# Patient Record
Sex: Female | Born: 1985 | Race: White | Hispanic: No | Marital: Married | Smoking: Former smoker
Health system: Southern US, Community
[De-identification: ages and names within clinical notes are randomized; demographics above are authoritative.]

## PROBLEM LIST (undated history)

## (undated) ENCOUNTER — Inpatient Hospital Stay (HOSPITAL_COMMUNITY): Payer: Self-pay

## (undated) DIAGNOSIS — F329 Major depressive disorder, single episode, unspecified: Secondary | ICD-10-CM

## (undated) DIAGNOSIS — F418 Other specified anxiety disorders: Secondary | ICD-10-CM

## (undated) DIAGNOSIS — K8020 Calculus of gallbladder without cholecystitis without obstruction: Secondary | ICD-10-CM

## (undated) DIAGNOSIS — Z91018 Allergy to other foods: Secondary | ICD-10-CM

## (undated) DIAGNOSIS — Z0000 Encounter for general adult medical examination without abnormal findings: Secondary | ICD-10-CM

## (undated) DIAGNOSIS — K859 Acute pancreatitis without necrosis or infection, unspecified: Secondary | ICD-10-CM

## (undated) DIAGNOSIS — R81 Glycosuria: Secondary | ICD-10-CM

## (undated) DIAGNOSIS — J45909 Unspecified asthma, uncomplicated: Secondary | ICD-10-CM

## (undated) DIAGNOSIS — T7840XA Allergy, unspecified, initial encounter: Secondary | ICD-10-CM

## (undated) DIAGNOSIS — F32A Depression, unspecified: Secondary | ICD-10-CM

## (undated) HISTORY — PX: DILATION AND CURETTAGE OF UTERUS: SHX78

## (undated) HISTORY — PX: OTHER SURGICAL HISTORY: SHX169

## (undated) HISTORY — DX: Allergy, unspecified, initial encounter: T78.40XA

## (undated) HISTORY — DX: Major depressive disorder, single episode, unspecified: F32.9

## (undated) HISTORY — DX: Encounter for general adult medical examination without abnormal findings: Z00.00

## (undated) HISTORY — DX: Other specified anxiety disorders: F41.8

## (undated) HISTORY — DX: Allergy to other foods: Z91.018

## (undated) HISTORY — DX: Depression, unspecified: F32.A

## (undated) HISTORY — DX: Unspecified asthma, uncomplicated: J45.909

## (undated) HISTORY — DX: Glycosuria: R81

---
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
## 2007-09-10 IMAGING — CT CT PELVIS W/ CM
2 of 5 series · 17 of 46 positions shown, 19 images · IV contrast (READICAT/WATER & [ID] OMNI 300)
Comparison: none

HISTORY: Periumbilical pelvic and left lower quadrant pain, one week status post
cholecystectomy

[Series 102: a&p w/ · axial · 0.64mm/px · z∈[-428,-62]mm · 14 of 399 slices shown, 16 images]
[im 17/399  soft-tissue]
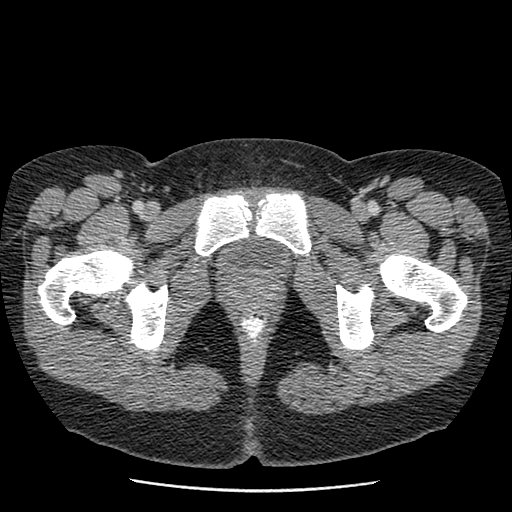
[im 17/399  bone]
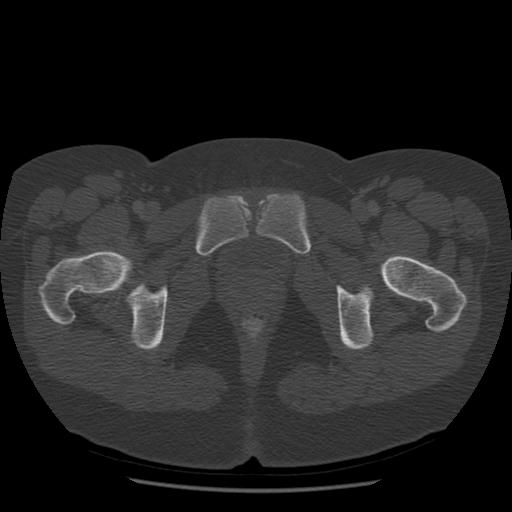
[im 50/399  soft-tissue]
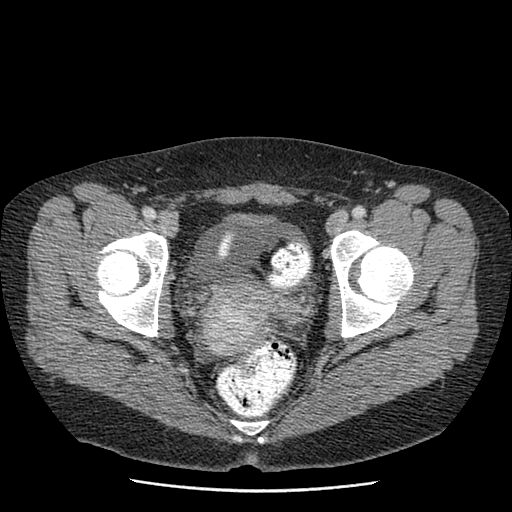
[im 83/399  soft-tissue]
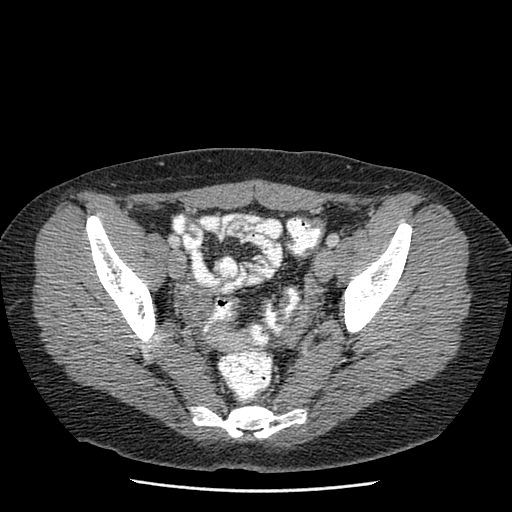
[im 100/399  soft-tissue]
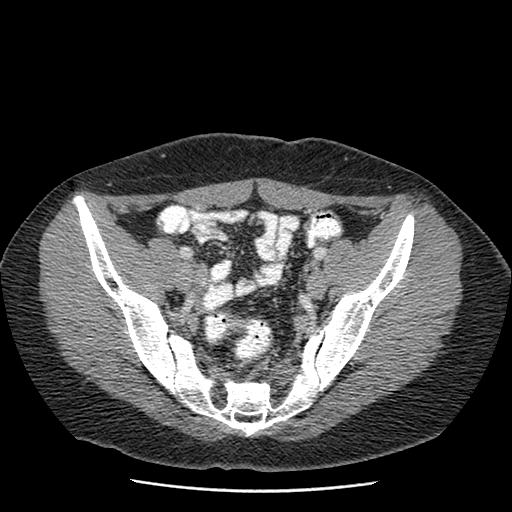
[im 133/399  soft-tissue]
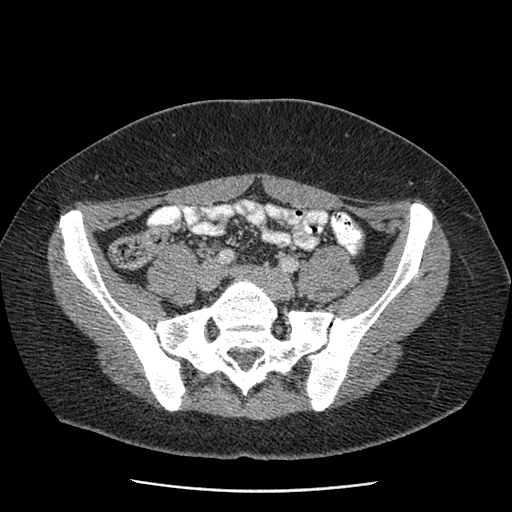
[im 166/399  soft-tissue]
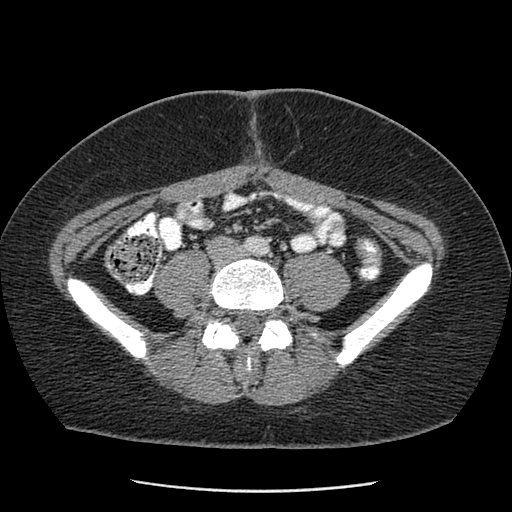
[im 183/399  soft-tissue]
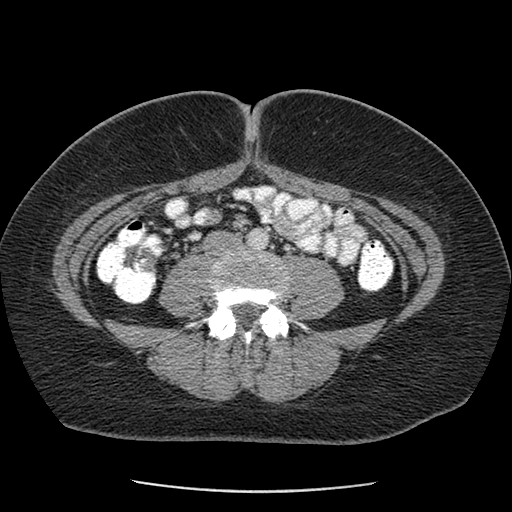
[im 216/399  soft-tissue]
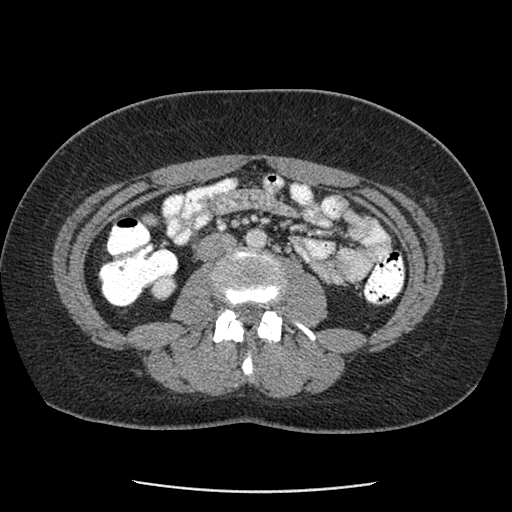
[im 233/399  soft-tissue]
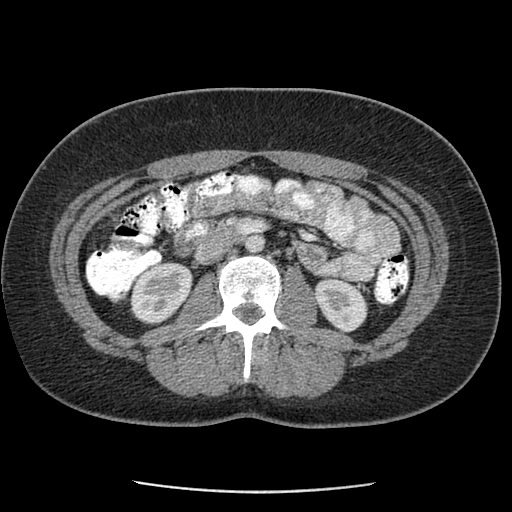
[im 233/399  bone]
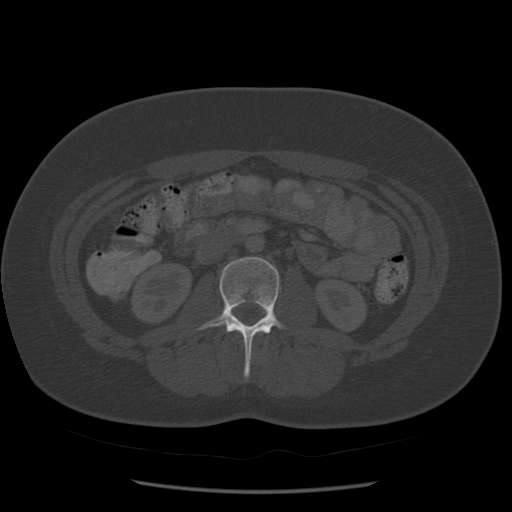
[im 266/399  soft-tissue]
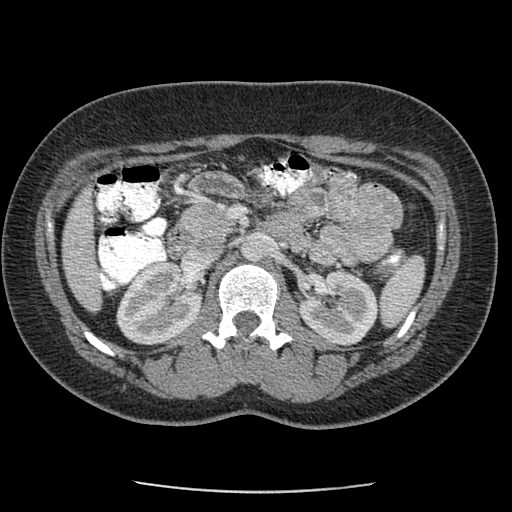
[im 299/399  soft-tissue]
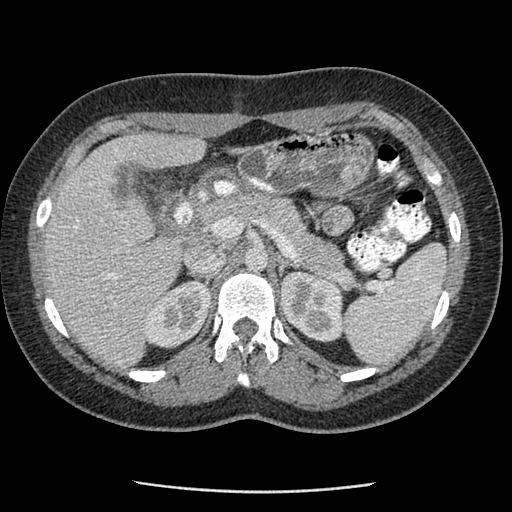
[im 316/399  soft-tissue]
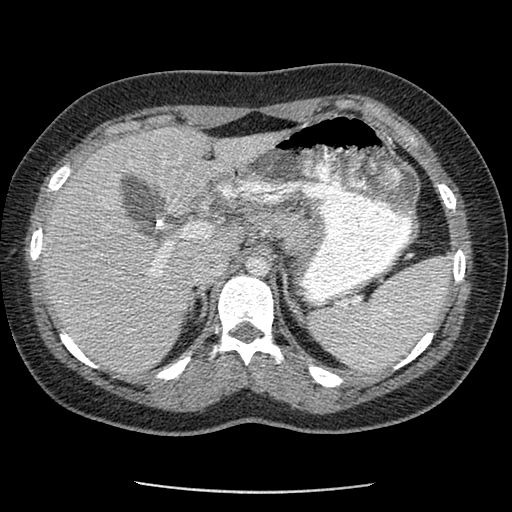
[im 349/399  soft-tissue]
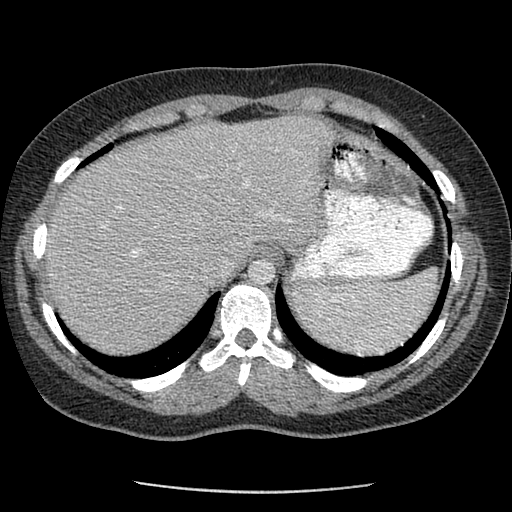
[im 382/399  soft-tissue]
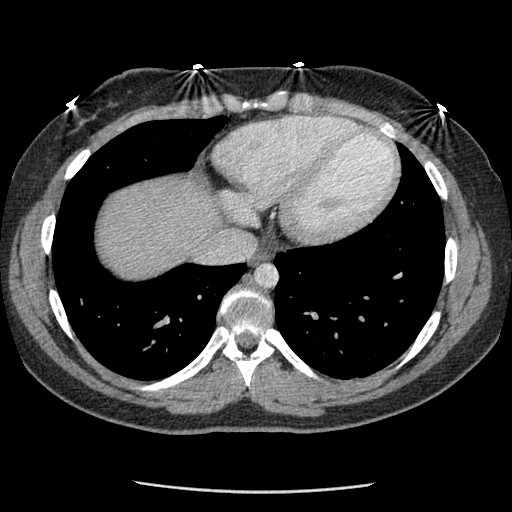

[Series 105: reformatted · coronal · 0.80mm/px · 3 of 86 slices shown]
[im 29/86  soft-tissue]
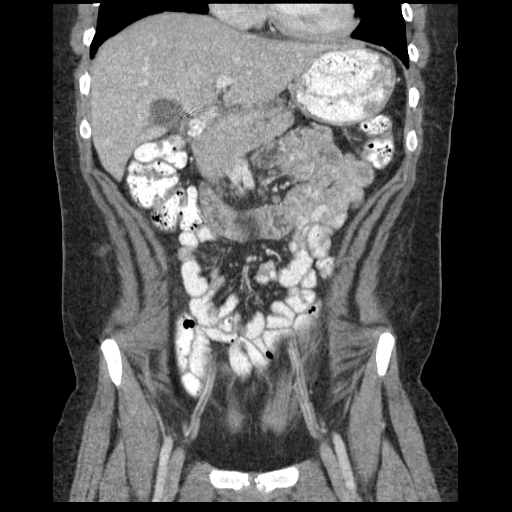
[im 38/86  soft-tissue]
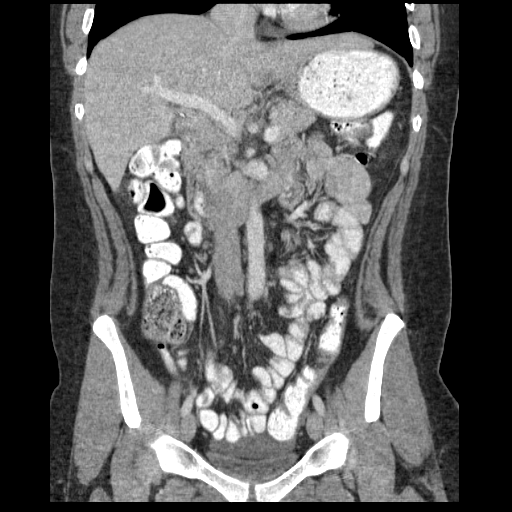
[im 48/86  soft-tissue]
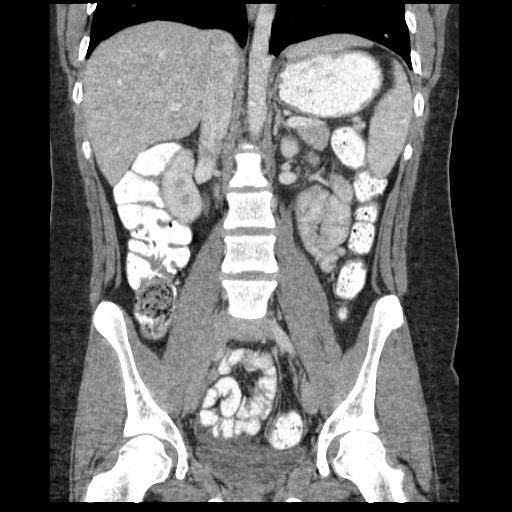

[17 of 46 positions shown; findings below may reference images not displayed]

CT ABDOMEN AND PELVIS WITH CONTRAST:

Multidetector helical CT imaging abdomen and pelvis performed.
Exam utilized dilute oral contrast and 125 cc [id number]-YHH.
No prior exam for comparison.

CT ABDOMEN:

Lung bases clear.
Status post cholecystectomy.
Small amount of fluid in gallbladder fossa.
Expected post surgical changes anterior abdominal wall and subhepatic region.
Remainder of liver, spleen, pancreas, kidneys, and adrenal glands normal.
Tiny splenule at splenic hilum.
Upper abdominal bowel loops normal appearance.
No mass, adenopathy, or hernia.
IMPRESSION: Status post cholecystectomy with small amount of nonspecific fluid in
gallbladder fossa.
This may be normal for postcholecystectomy patient, although it is not possible
by CT to exclude infection within this collection.
No evidence of enhancing margins or gas to suggest abscess.
Remainder exam unremarkable.

CT PELVIS:

Normal appendix.
Normal appearance of uterus and ovaries by CT.
Minimal free pelvic fluid.
Pelvic bowel loops normal appearance.
No mass, adenopathy, focal inflammatory process, or hernia.
IMPRESSION: Minimal free pelvic fluid.
Otherwise negative exam.

## 2009-03-15 ENCOUNTER — Encounter: Payer: Self-pay | Admitting: Family Medicine

## 2010-04-17 ENCOUNTER — Ambulatory Visit
Admission: RE | Admit: 2010-04-17 | Discharge: 2010-04-17 | Payer: Self-pay | Source: Home / Self Care | Attending: Family Medicine | Admitting: Family Medicine

## 2010-04-17 DIAGNOSIS — H9190 Unspecified hearing loss, unspecified ear: Secondary | ICD-10-CM | POA: Insufficient documentation

## 2010-04-17 DIAGNOSIS — Z8719 Personal history of other diseases of the digestive system: Secondary | ICD-10-CM | POA: Insufficient documentation

## 2010-04-17 DIAGNOSIS — K219 Gastro-esophageal reflux disease without esophagitis: Secondary | ICD-10-CM | POA: Insufficient documentation

## 2010-04-17 DIAGNOSIS — F411 Generalized anxiety disorder: Secondary | ICD-10-CM | POA: Insufficient documentation

## 2010-04-17 DIAGNOSIS — F418 Other specified anxiety disorders: Secondary | ICD-10-CM | POA: Insufficient documentation

## 2010-04-17 DIAGNOSIS — J309 Allergic rhinitis, unspecified: Secondary | ICD-10-CM | POA: Insufficient documentation

## 2010-04-17 DIAGNOSIS — E663 Overweight: Secondary | ICD-10-CM | POA: Insufficient documentation

## 2010-04-17 DIAGNOSIS — J45909 Unspecified asthma, uncomplicated: Secondary | ICD-10-CM | POA: Insufficient documentation

## 2010-04-17 HISTORY — DX: Other specified anxiety disorders: F41.8

## 2010-04-20 ENCOUNTER — Encounter: Payer: Self-pay | Admitting: Gastroenterology

## 2010-05-01 ENCOUNTER — Encounter: Payer: Self-pay | Admitting: Family Medicine

## 2010-05-01 ENCOUNTER — Ambulatory Visit: Admit: 2010-05-01 | Payer: Self-pay | Admitting: Family Medicine

## 2010-05-01 ENCOUNTER — Ambulatory Visit: Payer: Commercial Indemnity | Admitting: Family Medicine

## 2010-05-01 DIAGNOSIS — F329 Major depressive disorder, single episode, unspecified: Secondary | ICD-10-CM

## 2010-05-01 DIAGNOSIS — F411 Generalized anxiety disorder: Secondary | ICD-10-CM

## 2010-05-01 DIAGNOSIS — J45909 Unspecified asthma, uncomplicated: Secondary | ICD-10-CM

## 2010-05-01 DIAGNOSIS — K219 Gastro-esophageal reflux disease without esophagitis: Secondary | ICD-10-CM

## 2010-05-01 DIAGNOSIS — J309 Allergic rhinitis, unspecified: Secondary | ICD-10-CM

## 2010-05-01 DIAGNOSIS — Z8719 Personal history of other diseases of the digestive system: Secondary | ICD-10-CM

## 2010-05-01 NOTE — Assessment & Plan Note (Signed)
Summary: New pt anxiety attacks/dt [employer]   Vital Signs:  Patient profile:   25 year old female Menstrual status:  regular LMP:     03/17/2010 Height:      64.25 inches (163.19 cm) Weight:      149.50 pounds (67.95 kg) BMI:     25.55 O2 Sat:      99 % on Room air Temp:     97.7 degrees F (36.50 degrees C) oral Pulse rate:   72 / minute BP sitting:   129 / 78  (right arm)  Vitals Entered By: Josph Macho RMA (April 17, 2010 11:11 AM)  O2 Flow:  Room air CC: Establish new patient/ pt is having anxiety/depression/ CF Is Patient Diabetic? No LMP (date): 03/17/2010     Menstrual Status regular Enter LMP: 03/17/2010   History of Present Illness: patient is a 25 year old female who is in today to establish care. Patient is tearful and appropriately interactive but somewhat tearful off and on during the visit. Her major concern today is in fact her ongoing difficulties with depression and anxiety. She notes lately the symptoms have worsened somewhat. She has been taking the same man for the last 3 years and he is presently deployed for the first time overseas in [employer] and she finds herself very alone and tearful in this regard. She struggles with anxiety and has panic attacks with palpitations shortness of breath irritability and as a result 5 herself staying at home more and more. 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 She denies suicidal ideation but does acknowledge feeling desperate in the past. She reports taking a medication years ago but does not recall the name of it she took it once daily and she does believe it helped. She is willing to try medications again she has tried counseling in the past as well but does not want to restart at this point do to her fears of driving and going long distances. She is  a long history of allergies and GI upset and reflux; underwent allergy testing with ENT and was found to be allergic to many things including dog cat pollens grasses molds and milk. She reports her whole life given her bad heartburn abdominal cramps and diarrhea intermittently so she avoids it at most times. She had trouble with bilateral otitis media as a youngster and as a result of 20% hearing loss on the right-hand side. She has mild intermittent asthma which flares with exercise and with certain smells in particular. Uses albuterol very infrequently and only has an old expired inhaler at this time.  Preventive Screening-Counseling & Management  Alcohol-Tobacco     Smoking Status: quit  Caffeine-Diet-Exercise     Does Patient Exercise: yes      Drug Use:  no.    Current Medications (verified): 1)  Diazepam 2 Mg Tabs (Diazepam) .Marland Kitchen.. 1 Tab By Mouth As Needed  Allergies (verified): No Known Drug Allergies  Past History:  Past Surgical History: Cholecystectomy in 2007 with pancreatitis Wisdom teeth extracted  Family History: Father: 47:asthma, allergies, cholelithiasis Mother: 15: back disease Siblings:  Brother: 20: ADD, smoker Sister: 1: Anxiety D/O, smoker Sister: 15yo, ADD MGM: age unknown, overweight, depression possible bipolar, memory loss MGF: alcohol abuse PGM: early 50s, heart disease, cancer PGF:  early 18s, cholelithiasis, asthma, allergies Children: None  Social History: Former Smoker, very light occasional 1 cigarette, quit several months Single, boyfriend is serving in [employer] in [place] Occupation: just quit her job at a [employer] Considering online college Regular exercise-yes, runs 1-2 times a week Alcohol use-yes, occasional Seat belt regularly Diet: avoids dairy, red meat Drug use-no Smoking Status:  quit Occupation:  employed Does Patient Exercise:  yes Drug Use:  no  Review of Systems       The patient complains of  decreased hearing and depression.  The patient denies anorexia, fever, weight loss, weight gain, vision loss, hoarseness, chest pain, syncope, dyspnea on exertion, peripheral edema, prolonged cough, headaches, hemoptysis, abdominal pain, melena, hematochezia, severe indigestion/heartburn, hematuria, incontinence, muscle weakness, suspicious skin lesions, transient blindness, difficulty walking, unusual weight change, abnormal bleeding, and enlarged lymph nodes.    Physical Exam  General:  Well-developed,well-nourished,in no acute distress; alert,appropriate and cooperative throughout examination Head:  Normocephalic and atraumatic without obvious abnormalities. No apparent alopecia or balding. Eyes:  No corneal or conjunctival inflammation noted. EOMI, PERRLA Ears:  External ear exam shows no significant lesions or deformities.  Otoscopic examination reveals clear canals, tympanic membranes are intact bilaterally without bulging, retraction, inflammation or discharge. both TMs mildly dullHearing is grossly normal bilaterally. Nose:  External nasal examination shows no deformity or inflammation. Nasal mucosa are pink and moist without lesions or exudates. Mouth:  Oral mucosa and oropharynx without lesions or exudates.  Teeth in good repair. Neck:  No deformities, masses, or tenderness noted. Lungs:  Normal respiratory effort, chest expands symmetrically. Lungs are clear to auscultation, no crackles or wheezes. Heart:  Normal rate and regular rhythm. S1 and S2 normal without gallop, murmur, click, rub or other extra sounds. Abdomen:  Bowel sounds positive,abdomen soft and non-tender without masses, organomegaly or hernias noted. Msk:  No deformity or scoliosis noted of thoracic or lumbar spine.   Pulses:  R and L carotid,dorsalis pedis and posterior tibial pulses are full and equal bilaterally Extremities:  No clubbing, cyanosis, edema, or deformity noted with normal full range of motion of all joints.    Neurologic:  No cranial nerve deficits noted. Station and gait are normal. Plantar reflexes are down-going bilaterally. DTRs are symmetrical throughout. Sensory, motor and coordinative functions appear intact. Skin:  Intact without suspicious lesions or rashes Cervical Nodes:  No lymphadenopathy noted Psych:  Cognition and judgment appear intact. Alert and cooperative with normal attention span and concentration. No apparent delusions, illusions, hallucinations   Impression & Recommendations:  Problem # 1:  DEPRESSION (ICD-311)  The following medications were removed from the medication list:    Diazepam 2 Mg Tabs (Diazepam) .Marland Kitchen... 1 tab by mouth as needed Her updated medication list for this problem includes:    Alprazolam 0.25 Mg Tabs (Alprazolam) .Marland Kitchen... 1/2 to 1 tab by mouth daily as needed anxiety    Sertraline Hcl 50 Mg Tabs (Sertraline hcl) .Marland Kitchen... 1/2 tab by mouth once daily x 7 days then increase to 1 tab by mouth once daily Pateint agrees to start sertraline and use alprazolam as needed she is offered counseling would like to return in 2 weeks to rediscuss. If she worsens she will notify office and promises to talk to her family as well increase his arrest herself at this time.  Problem # 2:  GERD (ICD-530.81)  Her updated medication list for this problem includes:    Ranitidine Hcl 150 Mg Caps (Ranitidine hcl) .Marland KitchenMarland KitchenMarland KitchenMarland Kitchen 1  tab by mouth dialy as needed heartburn dx: reflux she will continue to avoid her symptoms she may use this medication as needed and avoid offending foods  Problem # 3:  ALLERGIC RHINITIS (ICD-477.9)  Her updated medication list for this problem includes:    Loratadine 10 Mg Tabs (Loratadine) .Marland Kitchen... 1 tab by mouth daily dx: allergies she is noting  Problem # 4:  ASTHMA, INTERMITTENT, MILD (ICD-493.90)  Her updated medication list for this problem includes:    Proventil Hfa 108 (90 Base) Mcg/act Aers (Albuterol sulfate) .Marland Kitchen... 1-2 puffs by mouth q 4-6 hours as  needed exercise/cough/wheeze/sob he is out of her inhaler at this time so a new prescription is sent over to her pharmacy. She does note use of a severe exercise sheet has trouble so she will try one half prior to exercise. She notes if she takes 2 puffs she often gets tremulous and more anxious so she is asked to try one puff first to try and minimize symptoms if no relief it may take a separate second puff and we may need to consider a switch to Xopenex if symptoms such as tachycardia and worsening anxiety first  Problem # 5:  PANCREATITIS, HX OF (ICD-V12.70) Will check an amylase and lipase with next blood draw due to her intermittent symptoms and hsitory. Her previous history of pancreatitis occured at the same time she had her gallbladder out for gallstones, avoid hi carb and hi fat meals and maintain good water intake  Problem # 6:  Screening Cervical Cancer (ICD-V76.2) Patient has become sexually active and has not had a pap smear to date, she is encouraged to consider doing a pap at her next visit or at least within the next few months. She agrees  Complete Medication List: 1)  Alprazolam 0.25 Mg Tabs (Alprazolam) .... 1/2 to 1 tab by mouth daily as needed anxiety 2)  Sertraline Hcl 50 Mg Tabs (Sertraline hcl) .... 1/2 tab by mouth once daily x 7 days then increase to 1 tab by mouth once daily 3)  Loratadine 10 Mg Tabs (Loratadine) .Marland Kitchen.. 1 tab by mouth daily dx: allergies 4)  Ranitidine Hcl 150 Mg Caps (Ranitidine hcl) .Marland Kitchen.. 1 tab by mouth dialy as needed heartburn dx: reflux 5)  Proventil Hfa 108 (90 Base) Mcg/act Aers (Albuterol sulfate) .Marland Kitchen.. 1-2 puffs by mouth q 4-6 hours as needed exercise/cough/wheeze/sob  Patient Instructions: 1)  Please schedule a follow-up appointment in 2 to 3 weeks. Call to be seen sooner if any concerns. 2)  Try to get regular exercise and 8 hours of sleep each day. 3)  Use Albuterol 1 puff by mouth prior to exercise to help your breathing. 4)  Avoid heavy simple  carb and fatty meals. Eat small frequent meals with lean proteins and brown carbs. Prescriptions: PROVENTIL HFA 108 (90 BASE) MCG/ACT AERS (ALBUTEROL SULFATE) 1-2 puffs by mouth q 4-6 hours as needed exercise/cough/wheeze/SOB  #1 x 3   Entered and Authorized by:   Danise Edge MD   Signed by:   Danise Edge MD on 04/17/2010   Method used:   Electronically to        [facility]  [address] [phone][facility] (retail)       [address] [place], [place]  [address]       Ph: [phone] or [phone]       Fax: [phone]   RxID:   [id number] RANITIDINE  HCL 150 MG CAPS (RANITIDINE HCL) 1 tab by mouth dialy as needed heartburn dx: reflux  #30 x 2   Entered and Authorized by:   Danise Edge MD   Signed by:   Danise Edge MD on 04/17/2010   Method used:   Electronically to        [facility]  [address] [phone]* (retail)       [address] [place], [place]  [address]       Ph: [phone] or [phone]       Fax: [phone]   RxID:   [id number] LORATADINE 10 MG TABS (LORATADINE) 1 tab by mouth daily dx: allergies  #30 x 2   Entered and Authorized by:   Danise Edge MD   Signed by:   Danise Edge MD on 04/17/2010   Method used:   [facility]ronically to        [facility]  [address] [phone][facility] (retail)       [address] [place], [place]  [address]       Ph: [phone] or [phone]       Fax: [phone]   RxID:   [id number] SERTRALINE HCL 50 MG TABS (SERTRALINE HCL) 1/2 tab by mouth once daily x 7 days then increase to 1 tab by mouth once daily  #30 x 1   Entered and Authorized by:   Danise Edge MD   Signed by:   Danise Edge MD on 04/17/2010   Method used:   [facility]ronically to        [facility]  [address] [phone]* (retail)       [address] [place], [place]  [address]       Ph: [phone] or [phone]       Fax: [phone]   RxID:   [id number] ALPRAZOLAM 0.25 MG TABS (ALPRAZOLAM) 1/2 to 1 tab by mouth daily as needed anxiety  #20 x 1    Entered and Authorized by:   Danise Edge MD   Signed by:   Danise Edge MD on 04/17/2010   Method used:   Print then Give to Patient   RxID:   [id number]    Orders Added: 1)  New Patient Level IV [14431]

## 2010-05-07 ENCOUNTER — Encounter: Payer: Self-pay | Admitting: Family Medicine

## 2010-05-07 NOTE — Assessment & Plan Note (Signed)
Summary: 2 WK FU/VFW   Vital Signs:  Patient profile:   25 year old female Menstrual status:  regular Height:      64.25 inches (163.19 cm) Weight:      148.75 pounds (67.61 kg) O2 Sat:      99 % on Room air Temp:     97.9 degrees F (36.61 degrees C) oral Pulse rate:   72 / minute BP sitting:   145 / 76  (left arm) Cuff size:   regular  Vitals Entered By: Josph Macho RMA (May 01, 2010 11:58 AM)  O2 Flow:  Room air CC: follow-up visit/ CF Is Patient Diabetic? No   History of Present Illness: patient is a 25 year old Caucasian female in today for followup. She reports overall she does believe her medications are helping. She continues to struggle with anxiety and depression but thinks her symptoms are slowly improving. She's been able to go out and do things in the world more without as much anxiety. She will still get an upset stomach and feel anxious but she is able to push through it. She does believe that certainly is making her feel more sleepy than usual amount of time that wears off at bedtime. She is taking it in the morning. She has used alprazolam on a couple of times but it makes her a little jittery. She is also struggling with some nasal congestion and a mild sore throat it's been worse over the last couple days. No fevers, chills, she has had a mild headache. She does believe the loratadine is helped with the nasal congestion and for the last few days. She has been getting reflux responded to ranitidine she's been taking intermittently. She had a bad episode of coughing in the day and one dose of Proventil to care of that. She denies any wheezing, chest pain, palpitations except when she is anxious or shortness of breath. No GI or GU complaints. Denies abdominal pain. Declines GYN exam today  Current Medications (verified): 1)  Alprazolam 0.25 Mg Tabs (Alprazolam) .... 1/2 To 1 Tab By Mouth Daily As Needed Anxiety 2)  Sertraline Hcl 50 Mg Tabs (Sertraline Hcl) .... 1/2  Tab By Mouth Once Daily X 7 Days Then Increase To 1 Tab By Mouth Once Daily 3)  Loratadine 10 Mg Tabs (Loratadine) .Marland Kitchen.. 1 Tab By Mouth Daily Dx: Allergies 4)  Ranitidine Hcl 150 Mg Caps (Ranitidine Hcl) .Marland Kitchen.. 1 Tab By Mouth Dialy As Needed Heartburn Dx: Reflux 5)  Proventil Hfa 108 (90 Base) Mcg/act Aers (Albuterol Sulfate) .Marland Kitchen.. 1-2 Puffs By Mouth Q 4-6 Hours As Needed Exercise/cough/wheeze/sob  Allergies (verified): No Known Drug Allergies  Past History:  Past medical history reviewed for relevance to current acute and chronic problems. Social history (including risk factors) reviewed for relevance to current acute and chronic problems.  Social History: Reviewed history from 04/17/2010 and no changes required. Former Smoker, very light occasional 1 cigarette, quit several months Single, boyfriend is serving in [employer] in [place] Occupation: just quit her job at a [employer] Considering online college Regular exercise-yes, runs 1-2 times a week Alcohol use-yes, occasional Seat belt regularly Diet: avoids dairy, red meat Drug use-no  Review of Systems      See HPI  Physical Exam  General:  Well-developed,well-nourished,in no acute distress; alert,appropriate and cooperative throughout examination Head:  Normocephalic and atraumatic without obvious abnormalities. No apparent alopecia or balding. Ears:  External ear exam shows no significant lesions or deformities.  Otoscopic examination reveals clear canals, tympanic membranes are intact bilaterally without bulging, retraction, inflammation or discharge. Hearing is grossly normal bilaterally. TMs mildly dull Nose:  External nasal examination shows no deformity or inflammation. Nasal mucosa are pink and moist without lesions or exudates. Mouth:  Oral mucosa and oropharynx without lesions or exudates.  Teeth in good repair. Neck:  No deformities, masses, or tenderness noted. Lungs:  Normal respiratory effort,  chest expands symmetrically. Lungs are clear to auscultation, no crackles or wheezes. Heart:  Normal rate and regular rhythm. S1 and S2 normal without gallop, murmur, click, rub or other extra sounds. Abdomen:  Bowel sounds positive,abdomen soft and non-tender without masses, organomegaly or hernias noted. Extremities:  No clubbing, cyanosis, edema, or deformity noted with normal full range of motion of all joints.   Cervical Nodes:  L anterior LN enlarged.   Psych:  Cognition and judgment appear intact. Alert and cooperative with normal attention span and concentration. No apparent delusions, illusions, hallucinations   Impression & Recommendations:  Problem # 1:  DEPRESSION (ICD-311)  The following medications were removed from the medication list:    Alprazolam 0.25 Mg Tabs (Alprazolam) .Marland Kitchen... 1/2 to 1 tab by mouth daily as needed anxiety, refills cancelled Her updated medication list for this problem includes:    Sertraline Hcl 50 Mg Tabs (Sertraline hcl) .Marland Kitchen... 1/2 tab by mouth once daily x 7 days then increase to 1 tab by mouth once daily    Klonopin 0.5 Mg Tabs (Clonazepam) .Marland Kitchen... 1 tab by mouth daily as needed anxiety call with any concerns  Problem # 2:  ALLERGIC RHINITIS (ICD-477.9)  Her updated medication list for this problem includes:    Loratadine 10 Mg Tabs (Loratadine) .Marland Kitchen... 1 tab by mouth daily dx: allergies    Fluticasone Propionate 50 Mcg/act Susp (Fluticasone propionate) .Marland Kitchen... 2 sprays each nostril as needed allergies may try mucinex as needed as wel if symptoms worsen call  Problem # 3:  ASTHMA, INTERMITTENT, MILD (ICD-493.90)  Her updated medication list for this problem includes:    Proventil Hfa 108 (90 Base) Mcg/act Aers (Albuterol sulfate) .Marland Kitchen... 1-2 puffs by mouth q 4-6 hours as needed exercise/cough/wheeze/sob only needed Proventil once and it was helpful  Problem # 4:  GERD (ICD-530.81)  Her updated medication list for this problem includes:    Ranitidine  Hcl 150 Mg Caps (Ranitidine hcl) .Marland Kitchen... 1 tab by mouth dialy as needed heartburn dx: reflux Good response, may cont to use prn  Problem # 5:  PANCREATITIS, HX OF (ICD-V12.70) Agrees to some lab work at her next visit, asymptomatic  Complete Medication List: 1)  Sertraline Hcl 50 Mg Tabs (Sertraline hcl) .... 1/2 tab by mouth once daily x 7 days then increase to 1 tab by mouth once daily 2)  Loratadine 10 Mg Tabs (Loratadine) .Marland Kitchen.. 1 tab by mouth daily dx: allergies 3)  Ranitidine Hcl 150 Mg Caps (Ranitidine hcl) .Marland Kitchen.. 1 tab by mouth dialy as needed heartburn dx: reflux 4)  Proventil Hfa 108 (90 Base) Mcg/act Aers (Albuterol sulfate) .Marland Kitchen.. 1-2 puffs by mouth q 4-6 hours as needed exercise/cough/wheeze/sob 5)  Klonopin 0.5 Mg Tabs (Clonazepam) .Marland Kitchen.. 1 tab by mouth daily as needed anxiety 6)  Fluticasone Propionate 50 Mcg/act Susp (Fluticasone propionate) .... 2 sprays each nostril as needed allergies  Patient Instructions: 1)  Please schedule a follow-up appointment in 1 to 2 months GYN 2)  Call with any concerns. 3)  For next week use Loratadine 10mg  by mouth once  daily, Fluticasone nasal spray 2 sprays daily and if no improvement add Mucinex 600mg  by mouth two times a day for 10 days, If fevers or thick green sputum develop call for futher treatment Prescriptions: FLUTICASONE PROPIONATE 50 MCG/ACT SUSP (FLUTICASONE PROPIONATE) 2 sprays each nostril as needed allergies  #1 x 3   Entered and Authorized by:   Danise Edge MD   Signed by:   Danise Edge MD on 05/01/2010   Method used:   [facility] to        [facility]  [address] [phone][facility] (retail)       [address] [place], [place]  [address]       Ph: [phone] or [phone]       Fax: [phone]   RxID:   [id number] KLONOPIN 0.5 MG TABS (CLONAZEPAM) 1 tab by mouth daily as needed anxiety  #20 x 1   Entered and Authorized by:   Danise Edge MD   Signed by:   Danise Edge MD on 05/01/2010   Method used:   Print then Give  to Patient   RxID:   [id number]    Orders Added: 1)  Est. Patient Level IV [41660]

## 2010-05-08 ENCOUNTER — Encounter: Payer: Self-pay | Admitting: Family Medicine

## 2010-05-21 NOTE — Letter (Signed)
Summary: Records from [place] at [facility] 2005 - 2010  Records from [place] at [facility] 2005 - 2010   Imported By: Maryln Gottron 05/16/2010 15:59:03  _____________________________________________________________________  External Attachment:    Type:   Image     Comment:   External Document

## 2010-06-03 ENCOUNTER — Ambulatory Visit: Payer: Commercial Indemnity | Admitting: Family Medicine

## 2010-06-24 ENCOUNTER — Other Ambulatory Visit: Payer: Self-pay | Admitting: Family Medicine

## 2010-06-24 MED ORDER — CLONAZEPAM 0.5 MG PO TABS: 0.5000 mg | ORAL_TABLET | ORAL | Status: DC | PRN

## 2010-08-15 NOTE — Discharge Summary (Signed)
Marisa Fletcher, Marisa Fletcher                ACCOUNT NO.:  [id number]   MEDICAL RECORD NO.:  [id number]          PATIENT TYPE:  INP   LOCATION:  5501                         FACILITY:  [facility]   PHYSICIAN:  Sandria Bales. Ezzard Standing, M.D.  DATE OF BIRTH:  04/14/1985   DATE OF ADMISSION:  02/12/2005  DATE OF DISCHARGE:  02/17/2005                                 DISCHARGE SUMMARY   DISCHARGE DIAGNOSES:  1.  Gallstone pancreatitis.  2.  Saponification of omentum at laparoscopy.  3.  Chronic cholecystitis with cholelithiasis.  4.  History of asthma.  5.  History of depression.   OPERATION PERFORMED:  The patient underwent a laparoscopic cholecystectomy  with intraoperative cholangiogram on the 20th of November 2006.   HISTORY OF PRESENT ILLNESS:  This is an 25 year old female who for the past  few months prior to admission had had epigastric pain, nausea, and vomiting.  She had been treated with some antireflux medication, but the pain had  persisted.   She presented to the [facility] emergency room on the 16th of November 2006 where  she was admitted by Dr. Avel Peace for acute  biliary pancreatitis. She  was noted to have a lipase of 242, SGOT 241, SGPT 434, alkaline phosphatase  122, and bilirubin of 0.6. She was placed on bowel rest and IV fluids. Her  amylase and labs returned towards normal. On the 20th of November I met with  the mother, father, and patient, discussed the options for proceeding with  surgical therapy. I discussed with him the gallbladder surgery and then on  the 25th of November she was taken to the operating room where she underwent  a laparoscopic cholecystectomy with intraoperative cholangiogram.   At the time of surgery she was noted to have some saponification of her  omentum consistent with her acute episode of acute pancreatitis.   Her final pathology of her gallbladder showed both chronic cholecystitis and  cholelithiasis.   On the 21st of November 2006, she was doing  well and ready for discharge.   She was instructed in a low fat diet for about one month's time. She should  not do any driving for about four to five days and no heavy lifting for two  weeks. She was given Vicodin for pain and is to call and see me back in the  office in about two weeks for an appointment.   DISCHARGE CONDITION:  Good.      Sandria Bales. Ezzard Standing, M.D.  Electronically Signed     DHN/MEDQ  D:  04/29/2005  T:  04/29/2005  Job:  [id number]

## 2010-08-15 NOTE — Op Note (Signed)
Marisa Fletcher, Marisa Fletcher                ACCOUNT NO.:  [id number]   MEDICAL RECORD NO.:  [id number]          PATIENT TYPE:  INP   LOCATION:  5501                         FACILITY:  [facility]   PHYSICIAN:  Sandria Bales. Ezzard Standing, M.D.  DATE OF BIRTH:  [birth year]   DATE OF PROCEDURE:  DATE OF DISCHARGE:                                 OPERATIVE REPORT   PREOPERATIVE DIAGNOSIS:  Gallstone pancreatitis.   POSTOPERATIVE DIAGNOSIS:  Gallstone pancreatitis, a complication of the  omentum, a hyperemic right fallopian tube.   PROCEDURE:  Laparoscopic cholecystectomy with intraoperative cholangiogram.   SURGEON:  Sandria Bales. Ezzard Standing, M.D.   FIRST ASSISTANT:  Timothy E. Earlene Plater, M.D.   ANESTHESIA:  General endotracheal.   ESTIMATED BLOOD LOSS:  Minimal.   INDICATION FOR PROCEDURE:  Marisa Fletcher is an 25 year old white female  admitted February 13, 2005 with pancreatitis.  Ultrasounds revealed small  gallstones.  She is felt to have gallstone pancreatitis.  She has since  improved clinically with improving amylase.  I discussed with the patient  and her parents the indications and potential complications of surgery.  The  potential complications include but are not limited to bleeding, infection,  bile duct injury, and possibly of open surgery.   She comes now for attempted laparoscopic cholecystectomy.   OPERATIVE NOTE:  The patient was placed in a supine position and given a  general endotracheal anesthetic.  She had her abdomen prepped with Betadine  solution and sterilely draped.  She was already of Cefoxitin as an  antibiotic.  Her abdomen was then draped.  I went through an infraumbilical  incision with sharp dissection carried down to the abdominal cavity.  A 0-  degree, 10-mm laparoscope was inserted through a 12-mm Hasson trocar.  The  Hasson trocar was secured with a 0 Vicryl suture, and I then did abdominal  exploration.   The right and left lobes of the liver were unremarkable. The anterior  wall  of the stomach was unremarkable.  The patient though had these little  punctate areas of what is saponification of her gastrocolic ligament and  down on her omentum.  She also had probably 300-500 cc of free kind of maybe  mildly blood-tinged fluid particularly in the right colonic gutter and down  towards the pelvis.  She also had a hyperemic right fallopian tube.  I am  not really sure what the meaning of this was.  There was no obvious  purulence around this.  The remainder of the bowel that I could see was  unremarkable.  I placed three additional trocars, a 10-mm subxiphoid trocar,  a 5-mm right mid-subcostal, and a 5-mm lateral subcostal.   I grabbed the gallbladder, rotated it cephalad.  Actually, except for down  around the neck of the gallbladder, the gallbladder was pretty much  uninvolved with pancreatitis.  I first identified the cystic artery which a  large cystic artery coming off a branch diving into the liver.  I placed a  single clip across this.  I then identified the cystic duct which also was  pretty generous in size  and placed a clip on this.  I then shot an  intraoperative cholangiogram.   The intraoperative cholangiogram was shot using a cut off Taut catheter  inserted through a 14-gauge Jelco catheter, and I placed the cut Taut  catheter in the site of the cystic duct and secured this with an Endoclip.   Using half-strength Hypaque solution, I used about 7 or 8 cc, injected this  down the cystic duct into the common bile duct and duodenum.  It refluxed up  the hepatic radicals.  The patient had a fairly normal smallish common bile  duct.  It emptied promptly.  It also showed a little bit of the pancreatic  duct, but there was no filling defect.  There was no evidence of gallstone  in the common bile duct, and it was felt to be a normal intraoperative  cholangiogram.   The Taut catheter was then removed and the cystic duct triply Endoclipped  and divided.   The cystic artery was identified, triple Endoclipped, and  divided.  I then sharply and bluntly dissected the gallbladder from the  gallbladder bed.  There was one more branch at the right side of the edge of  the gallbladder which I cauterized to control.   Prior to complete division of the gallbladder from the gallbladder bed, I  revisualized the gallbladder bed.  There was no bile leak or bleeding.  I  then divided the gallbladder, delivered it through the umbilicus, and sent  it to pathology. I irrigated her abdomen out with 2 liters of saline.  I  then removed the trocars in turn.  The umbilical trocar was closed with a 0  Vicryl suture. The skin at each site was closed using a 5-0 Vicryl suture,  painted with tincture of Benzoin and Steri-Stripped.   The patient tolerated the procedure well and was transported to the recovery  room in good condition.  Sponge and needle counts were correct at the end of  the case.      Sandria Bales. Ezzard Standing, M.D.  Electronically Signed     DHN/MEDQ  D:  02/16/2005  T:  02/16/2005  Job:  [id number]

## 2010-08-15 NOTE — H&P (Signed)
NAMELASHARA, Marisa Fletcher                ACCOUNT NO.:  [id number]   MEDICAL RECORD NO.:  [id number]          PATIENT TYPE:  EMS   LOCATION:  MAJO                         FACILITY:  [facility]   PHYSICIAN:  Adolph Pollack, M.D.DATE OF BIRTH:  [birth year]   DATE OF ADMISSION:  02/12/2005  DATE OF DISCHARGE:                                HISTORY & PHYSICAL   CHIEF COMPLAINT:  Epigastric pain, nausea and vomiting.   HISTORY OF PRESENT ILLNESS:  This an 19-old-female who for the past few  months has been having intermittent episodes of epigastric pain, nausea and  vomiting.  She was given anti-reflux medicine because she was having some  heartburn; however, the pain persisted.  It got particularly bad this  afternoon, and she was brought to the emergency room where she was  evaluated.  At the time she was noted to have some elevation of her liver  function tests and lipase and cholelithiasis.  I was subsequently asked to  see her.  She denies any jaundice, fever or chills.   PAST MEDICAL HISTORY:  1.  Asthma.  2.  Depression.  3.  Acne.   PAST SURGICAL HISTORY:  Wisdom teeth extraction.   MEDICATIONS:  1.  Prozac.  2.  Singulair.  3.  Albuterol MDI.  4.  Advair.  5.  Acne medications.  6.  Antiemetic.   SOCIAL HISTORY:  She is single.  Lives with her parents.   REVIEW OF SYSTEMS:  CARDIOVASCULAR:  No hypertension or heart disease.  PULMONARY:  Has had pneumonia in the past.  GI:  No known hepatitis, no  diverticulitis or colitis.  GENITOURINARY:  No kidney stones or bladder  infections.  ENDOCRINE:  No hypothyroidism.  NEUROLOGIC:  No seizures.  HEMATOLOGIC:  No known bleeding disorders.   PHYSICAL EXAMINATION:  GENERAL:  A well-developed and well-nourished female,  in no acute distress.  VITAL SIGNS:  Temperature 97.2 degrees, blood pressure 99/60, pulse 67.  HEENT:  Eyes:  Extraocular motions intact.  No icterus.  A diffuse acneform  rash about the face.  NECK:  Supple without  masses or thyroid enlargement.  LUNGS:  Breath sounds equal and clear.  Respirations unlabored.  CARDIOVASCULAR:  Demonstrates a regular rate and rhythm.  No murmur heard.  No jugular venous distention.  ABDOMEN:  Soft, no palpable masses or organomegaly.  There is epigastric  tenderness.  Active bowel sounds were noted.  EXTREMITIES:  Good muscle tone, good range of motion.   LABORATORY DATA:  CBC is within normal limits.  Lipase is 242.  SGOT 241,  SGPT 434, alkaline phosphatase 122, total bilirubin 0.6.  Potassium slightly  low at 3.4.   IMPRESSION:  1.  Acute biliary pancreatitis.  2.  Asthma.   PLAN:  Admission to the hospital.  Bowel rest and IV fluid hydration.  Serial labs.  I suggested a laparoscopic cholecystectomy this admission.  I  discussed the procedure and the risks.  The risks include but are not  limited to bleeding, infection, wound healing problems, anesthesia, injury  to the common bile duct/liver/small intestine/bowel leak  and post-  cholecystectomy diarrhea.  She and her mother seem to understand and agree  with the plan.      Adolph Pollack, M.D.  Electronically Signed     TJR/MEDQ  D:  02/13/2005  T:  02/13/2005  Job:  [id number]

## 2010-08-18 ENCOUNTER — Other Ambulatory Visit: Payer: Self-pay | Admitting: Family Medicine

## 2010-08-19 MED ORDER — CLONAZEPAM 0.5 MG PO TABS: 0.5000 mg | ORAL_TABLET | ORAL | Status: DC | PRN

## 2011-02-17 ENCOUNTER — Other Ambulatory Visit: Payer: Self-pay

## 2011-02-17 MED ORDER — CLONAZEPAM 0.5 MG PO TABS: 0.5000 mg | ORAL_TABLET | ORAL | Status: DC | PRN

## 2011-02-17 NOTE — Telephone Encounter (Signed)
Please advise 

## 2011-06-29 ENCOUNTER — Other Ambulatory Visit: Payer: Self-pay

## 2011-06-29 MED ORDER — CLONAZEPAM 0.5 MG PO TABS: 0.5000 mg | ORAL_TABLET | ORAL | Status: DC | PRN

## 2011-06-29 NOTE — Telephone Encounter (Signed)
RX faxed to pharmacy.

## 2011-06-29 NOTE — Telephone Encounter (Signed)
RX was wrote on 02-17-11 X 2 refills. Please advise refill?

## 2012-01-11 ENCOUNTER — Other Ambulatory Visit: Payer: Self-pay

## 2012-01-11 NOTE — Telephone Encounter (Signed)
Please advise refill? Last RX wrote on 06-29-11 quantity 30 with 2 refills.  If ok fax to [phone]

## 2012-01-11 NOTE — Telephone Encounter (Signed)
OK to refill Clonazepam with same strength, sig, #30 2 rf

## 2012-01-12 MED ORDER — CLONAZEPAM 0.5 MG PO TABS: 0.5000 mg | ORAL_TABLET | ORAL | Status: DC | PRN

## 2012-01-12 NOTE — Telephone Encounter (Signed)
Faxed to pharmacy

## 2012-05-02 ENCOUNTER — Other Ambulatory Visit: Payer: Self-pay | Admitting: Family Medicine

## 2012-05-02 NOTE — Telephone Encounter (Signed)
Pt has not been seen since 05-01-10. NO REFILLS until pt is seen

## 2012-05-03 ENCOUNTER — Other Ambulatory Visit: Payer: Self-pay | Admitting: Family Medicine

## 2012-05-10 ENCOUNTER — Telehealth: Payer: Self-pay | Admitting: Family Medicine

## 2012-05-10 NOTE — Telephone Encounter (Signed)
If you look in the chart the patient has not been seen since 04-2010. Pt must have an appt for refills.

## 2012-05-10 NOTE — Telephone Encounter (Signed)
So I agree, if she wants a refill on Clonazepam she needs to be seen roughly annually, it is a serious controlled substance so if you prescribe to patients you have to monitor it. I am willing to let her have 5 tabs with same sig, same strength to help with any anxiety attacks until she gets in.

## 2012-05-10 NOTE — Telephone Encounter (Signed)
Left a detailed message on patients voicemail.

## 2012-05-10 NOTE — Telephone Encounter (Signed)
FYI: Pt states she has no insurance. Pt has not been seen since 04-2010 and i informed pt that [facility] rules are that you must be seen at least once a year and this has been 2 years.

## 2012-07-04 ENCOUNTER — Encounter: Payer: Self-pay | Admitting: Family Medicine

## 2012-07-04 ENCOUNTER — Other Ambulatory Visit: Payer: Self-pay | Admitting: Family Medicine

## 2012-07-04 ENCOUNTER — Ambulatory Visit (INDEPENDENT_AMBULATORY_CARE_PROVIDER_SITE_OTHER): Payer: Commercial Indemnity | Admitting: Family Medicine

## 2012-07-04 VITALS — BP 122/88 | HR 55 | Temp 98.2°F | Ht 64.25 in | Wt 162.1 lb

## 2012-07-04 DIAGNOSIS — Z0000 Encounter for general adult medical examination without abnormal findings: Secondary | ICD-10-CM

## 2012-07-04 DIAGNOSIS — F341 Dysthymic disorder: Secondary | ICD-10-CM

## 2012-07-04 DIAGNOSIS — F419 Anxiety disorder, unspecified: Secondary | ICD-10-CM

## 2012-07-04 DIAGNOSIS — F32A Depression, unspecified: Secondary | ICD-10-CM

## 2012-07-04 DIAGNOSIS — J45909 Unspecified asthma, uncomplicated: Secondary | ICD-10-CM

## 2012-07-04 DIAGNOSIS — T7840XA Allergy, unspecified, initial encounter: Secondary | ICD-10-CM

## 2012-07-04 DIAGNOSIS — F329 Major depressive disorder, single episode, unspecified: Secondary | ICD-10-CM

## 2012-07-04 DIAGNOSIS — F418 Other specified anxiety disorders: Secondary | ICD-10-CM

## 2012-07-04 LAB — CBC
HCT: 39.7 % (ref 36.0–46.0)
MCH: 28.6 pg (ref 26.0–34.0)
MCHC: 33.8 g/dL (ref 30.0–36.0)
MCV: 84.6 fL (ref 78.0–100.0)
Platelets: 250 10*3/uL (ref 150–400)
RDW: 13.5 % (ref 11.5–15.5)
WBC: 5.4 10*3/uL (ref 4.0–10.5)

## 2012-07-04 MED ORDER — CLONAZEPAM 0.5 MG PO TABS: 0.5000 mg | ORAL_TABLET | ORAL | Status: DC | PRN

## 2012-07-04 MED ORDER — ALBUTEROL SULFATE HFA 108 (90 BASE) MCG/ACT IN AERS: 2.0000 | INHALATION_SPRAY | Freq: Four times a day (QID) | RESPIRATORY_TRACT | Status: DC | PRN

## 2012-07-04 MED ORDER — SERTRALINE HCL 50 MG PO TABS: ORAL_TABLET | ORAL | Status: DC

## 2012-07-04 MED ORDER — LORATADINE 10 MG PO TABS: 10.0000 mg | ORAL_TABLET | Freq: Every day | ORAL | Status: DC

## 2012-07-04 NOTE — Patient Instructions (Addendum)
Next appt GYN   Anxiety and Panic Attacks Your caregiver has informed you that you are having an anxiety or panic attack. There may be many forms of this. Most of the time these attacks come suddenly and without warning. They come at any time of day, including periods of sleep, and at any time of life. They may be strong and unexplained. Although panic attacks are very scary, they are physically harmless. Sometimes the cause of your anxiety is not known. Anxiety is a protective mechanism of the body in its fight or flight mechanism. Most of these perceived danger situations are actually nonphysical situations (such as anxiety over losing a job). CAUSES  The causes of an anxiety or panic attack are many. Panic attacks may occur in otherwise healthy people given a certain set of circumstances. There may be a genetic cause for panic attacks. Some medications may also have anxiety as a side effect. SYMPTOMS  Some of the most common feelings are:  Intense terror.  Dizziness, feeling faint.  Hot and cold flashes.  Fear of going crazy.  Feelings that nothing is real.  Sweating.  Shaking.  Chest pain or a fast heartbeat (palpitations).  Smothering, choking sensations.  Feelings of impending doom and that death is near.  Tingling of extremities, this may be from over-breathing.  Altered reality (derealization).  Being detached from yourself (depersonalization). Several symptoms can be present to make up anxiety or panic attacks. DIAGNOSIS  The evaluation by your caregiver will depend on the type of symptoms you are experiencing. The diagnosis of anxiety or panic attack is made when no physical illness can be determined to be a cause of the symptoms. TREATMENT  Treatment to prevent anxiety and panic attacks may include:  Avoidance of circumstances that cause anxiety.  Reassurance and relaxation.  Regular exercise.  Relaxation therapies, such as yoga.  Psychotherapy with a  psychiatrist or therapist.  Avoidance of caffeine, alcohol and illegal drugs.  Prescribed medication. SEEK IMMEDIATE MEDICAL CARE IF:   You experience panic attack symptoms that are different than your usual symptoms.  You have any worsening or concerning symptoms. Document Released: 03/16/2005 Document Revised: 06/08/2011 Document Reviewed: 07/18/2009 [facility] Patient Information 2013 [place], [place].

## 2012-07-05 LAB — LIPID PANEL
Cholesterol: 127 mg/dL (ref 0–200)
HDL: 51 mg/dL (ref >39)
LDL Cholesterol: 65 mg/dL (ref 0–99)
Total CHOL/HDL Ratio: 2.5 Ratio
Triglycerides: 55 mg/dL (ref <150)
VLDL: 11 mg/dL (ref 0–40)

## 2012-07-05 LAB — PHOSPHORUS: Phosphorus: 3.7 mg/dL (ref 2.3–4.6)

## 2012-07-05 LAB — BASIC METABOLIC PANEL
BUN: 10 mg/dL (ref 6–23)
CO2: 28 mEq/L (ref 19–32)
Calcium: 9.5 mg/dL (ref 8.4–10.5)
Chloride: 105 mEq/L (ref 96–112)
Creat: 0.74 mg/dL (ref 0.50–1.10)
Glucose, Bld: 87 mg/dL (ref 70–99)
Potassium: 4.4 mEq/L (ref 3.5–5.3)
Sodium: 139 mEq/L (ref 135–145)

## 2012-07-05 LAB — HEPATIC FUNCTION PANEL
ALT: 14 U/L (ref 0–35)
AST: 19 U/L (ref 0–37)
Bilirubin, Direct: 0.1 mg/dL (ref 0.0–0.3)
Indirect Bilirubin: 0.3 mg/dL (ref 0.0–0.9)

## 2012-07-05 LAB — TSH: TSH: 0.623 u[IU]/mL (ref 0.350–4.500)

## 2012-07-09 ENCOUNTER — Encounter: Payer: Self-pay | Admitting: Family Medicine

## 2012-07-09 DIAGNOSIS — Z0000 Encounter for general adult medical examination without abnormal findings: Secondary | ICD-10-CM

## 2012-07-09 HISTORY — DX: Encounter for general adult medical examination without abnormal findings: Z00.00

## 2012-07-09 NOTE — Progress Notes (Signed)
Patient ID: Loveah Like, female   DOB: 1985-09-06, 27 y.o.   MRN: [id number] Marisa Fletcher [id number] [birth year] 07/09/2012      Progress Note New Patient  Subjective  Chief Complaint  Chief Complaint  Patient presents with  . Establish Care    re-establish    HPI  Patient is a 27 year old Caucasian female who is here today to reestablish care and have an annual exam. She has previously been maintained on sertraline but has been out of it. She is in school and working and under a lot of stress. Does agree to restart medication because she does feel that helps her to stabilize her mood. No suicidal ideation. She has had some intermittent anxiety attacks and does respond to clonazepam but is not having them as frequently as she once did. No recent illness. Does have some trouble with allergies but her asthma has not flared. Some minor nasal congestion and irritation is noted. No fevers or chills. No headaches or chest pains. No shortness of breath GI or GU concerns noted today.  Past Medical History  Diagnosis Date  . Allergy   . Asthma   . Depression   . Depression with anxiety 04/17/2010    Qualifier: Diagnosis of  By: Abner Greenspan MD, Misty Stanley    . Preventative health care 07/09/2012    Past Surgical History  Procedure Laterality Date  . Cholecystectomy  2007    with pancreatitis  . Wisdom teeth extracted      Family History  Problem Relation Age of Onset  . Cholelithiasis Father   . Allergies Father   . Asthma Father   . Anxiety disorder Sister   . ADD / ADHD Brother     ADD  . Memory loss Maternal Grandmother   . Depression Maternal Grandmother     possible bipolar  . Obesity Maternal Grandmother   . Alcohol abuse Maternal Grandfather   . Cancer Paternal Grandmother   . Heart disease Paternal Grandmother   . Asthma Paternal Grandfather   . Allergies Paternal Grandfather   . Cholelithiasis Paternal Grandfather   . ADD / ADHD Sister     ADD  . Other Mother     Back disease     History   Social History  . Marital Status: Married    Spouse Name: N/A    Number of Children: N/A  . Years of Education: N/A   Occupational History  . Not on file.   Social History Main Topics  . Smoking status: Former Smoker -- 0.10 packs/day    Quit date: 12/28/2009  . Smokeless tobacco: Not on file  . Alcohol Use: 0.0 oz/week    0 drink(s) per week  . Drug Use: No  . Sexually Active:    Other Topics Concern  . Not on file   Social History Narrative  . No narrative on file    Current Outpatient Prescriptions on File Prior to Visit  Medication Sig Dispense Refill  . loratadine (CLARITIN) 10 MG tablet Take 10 mg by mouth daily. For allergies        No current facility-administered medications on file prior to visit.    Not on File  Review of Systems  Review of Systems  Constitutional: Negative for fever, chills and malaise/fatigue.  HENT: Negative for hearing loss, nosebleeds and congestion.   Eyes: Negative for discharge.  Respiratory: Negative for cough, sputum production, shortness of breath and wheezing.   Cardiovascular: Negative for chest pain, palpitations and leg swelling.  Gastrointestinal: Negative for heartburn, nausea, vomiting, abdominal pain, diarrhea, constipation and blood in stool.  Genitourinary: Negative for dysuria, urgency, frequency and hematuria.  Musculoskeletal: Negative for myalgias, back pain and falls.  Skin: Negative for rash.  Neurological: Negative for dizziness, tremors, sensory change, focal weakness, loss of consciousness, weakness and headaches.  Endo/Heme/Allergies: Negative for polydipsia. Does not bruise/bleed easily.  Psychiatric/Behavioral: Positive for depression. Negative for suicidal ideas. The patient is nervous/anxious. The patient does not have insomnia.     Objective  BP 122/88  Pulse 55  Temp(Src) 98.2 F (36.8 C) (Oral)  Ht 5' 4.25" (1.632 m)  Wt 162 lb 1.3 oz (73.519 kg)  BMI 27.6 kg/m2  SpO2 97%   LMP 07/02/2012  Physical Exam  Physical Exam  Constitutional: She is oriented to person, place, and time and well-developed, well-nourished, and in no distress. No distress.  HENT:  Head: Normocephalic and atraumatic.  Right Ear: External ear normal.  Left Ear: External ear normal.  Nose: Nose normal.  Mouth/Throat: Oropharynx is clear and moist. No oropharyngeal exudate.  Eyes: Conjunctivae are normal. Pupils are equal, round, and reactive to light. Right eye exhibits no discharge. Left eye exhibits no discharge. No scleral icterus.  Neck: Normal range of motion. Neck supple. No thyromegaly present.  Cardiovascular: Normal rate, regular rhythm, normal heart sounds and intact distal pulses.   No murmur heard. Pulmonary/Chest: Effort normal and breath sounds normal. No respiratory distress. She has no wheezes. She has no rales.  Abdominal: Soft. Bowel sounds are normal. She exhibits no distension and no mass. There is no tenderness.  Musculoskeletal: Normal range of motion. She exhibits no edema and no tenderness.  Lymphadenopathy:    She has no cervical adenopathy.  Neurological: She is alert and oriented to person, place, and time. She has normal reflexes. No cranial nerve deficit. Coordination normal.  Skin: Skin is warm and dry. No rash noted. She is not diaphoretic.  Psychiatric: Mood, memory and affect normal.       Assessment & Plan  Depression with anxiety Is working and in school and feels she needs to go back on her Sertraline, is given rx for this and Clonazepam to use prn.   ASTHMA, INTERMITTENT, MILD Given rx for Albuterol to use prn has not had any recent flares.  Preventative health care Encouraged DASH diet, increase exercise, adequate sleep. Annual labs checked. Return for pap at next visit.

## 2012-07-09 NOTE — Assessment & Plan Note (Signed)
Encouraged DASH diet, increase exercise, adequate sleep. Annual labs checked. Return for pap at next visit.

## 2012-07-09 NOTE — Assessment & Plan Note (Signed)
Is working and in school and feels she needs to go back on her Sertraline, is given rx for this and Clonazepam to use prn.

## 2012-07-09 NOTE — Assessment & Plan Note (Signed)
Given rx for Albuterol to use prn has not had any recent flares.

## 2012-07-29 ENCOUNTER — Ambulatory Visit: Payer: Commercial Indemnity | Admitting: Family Medicine

## 2012-09-05 ENCOUNTER — Ambulatory Visit (INDEPENDENT_AMBULATORY_CARE_PROVIDER_SITE_OTHER): Payer: Commercial Indemnity | Admitting: Family Medicine

## 2012-09-05 ENCOUNTER — Encounter: Payer: Self-pay | Admitting: Family Medicine

## 2012-09-05 VITALS — BP 98/72 | HR 54 | Temp 98.2°F | Ht 64.25 in | Wt 165.1 lb

## 2012-09-05 DIAGNOSIS — E663 Overweight: Secondary | ICD-10-CM

## 2012-09-05 DIAGNOSIS — F341 Dysthymic disorder: Secondary | ICD-10-CM

## 2012-09-05 DIAGNOSIS — F418 Other specified anxiety disorders: Secondary | ICD-10-CM

## 2012-09-05 DIAGNOSIS — F419 Anxiety disorder, unspecified: Secondary | ICD-10-CM

## 2012-09-05 DIAGNOSIS — J45909 Unspecified asthma, uncomplicated: Secondary | ICD-10-CM

## 2012-09-05 MED ORDER — CLONAZEPAM 0.5 MG PO TABS: 0.5000 mg | ORAL_TABLET | ORAL | Status: DC | PRN

## 2012-09-05 MED ORDER — ESCITALOPRAM OXALATE 10 MG PO TABS: 10.0000 mg | ORAL_TABLET | Freq: Every day | ORAL | Status: DC

## 2012-09-05 NOTE — Patient Instructions (Addendum)
Try ginger  Nausea and Vomiting Nausea is a sick feeling that often comes before throwing up (vomiting). Vomiting is a reflex where stomach contents come out of your mouth. Vomiting can cause severe loss of body fluids (dehydration). Children and elderly adults can become dehydrated quickly, especially if they also have diarrhea. Nausea and vomiting are symptoms of a condition or disease. It is important to find the cause of your symptoms. CAUSES   Direct irritation of the stomach lining. This irritation can result from increased acid production (gastroesophageal reflux disease), infection, food poisoning, taking certain medicines (such as nonsteroidal anti-inflammatory drugs), alcohol use, or tobacco use.  Signals from the brain.These signals could be caused by a headache, heat exposure, an inner ear disturbance, increased pressure in the brain from injury, infection, a tumor, or a concussion, pain, emotional stimulus, or metabolic problems.  An obstruction in the gastrointestinal tract (bowel obstruction).  Illnesses such as diabetes, hepatitis, gallbladder problems, appendicitis, kidney problems, cancer, sepsis, atypical symptoms of a heart attack, or eating disorders.  Medical treatments such as chemotherapy and radiation.  Receiving medicine that makes you sleep (general anesthetic) during surgery. DIAGNOSIS Your caregiver may ask for tests to be done if the problems do not improve after a few days. Tests may also be done if symptoms are severe or if the reason for the nausea and vomiting is not clear. Tests may include:  Urine tests.  Blood tests.  Stool tests.  Cultures (to look for evidence of infection).  X-rays or other imaging studies. Test results can help your caregiver make decisions about treatment or the need for additional tests. TREATMENT You need to stay well hydrated. Drink frequently but in small amounts.You may wish to drink water, sports drinks, clear broth, or  eat frozen ice pops or gelatin dessert to help stay hydrated.When you eat, eating slowly may help prevent nausea.There are also some antinausea medicines that may help prevent nausea. HOME CARE INSTRUCTIONS   Take all medicine as directed by your caregiver.  If you do not have an appetite, do not force yourself to eat. However, you must continue to drink fluids.  If you have an appetite, eat a normal diet unless your caregiver tells you differently.  Eat a variety of complex carbohydrates (rice, wheat, potatoes, bread), lean meats, yogurt, fruits, and vegetables.  Avoid high-fat foods because they are more difficult to digest.  Drink enough water and fluids to keep your urine clear or pale yellow.  If you are dehydrated, ask your caregiver for specific rehydration instructions. Signs of dehydration may include:  Severe thirst.  Dry lips and mouth.  Dizziness.  Dark urine.  Decreasing urine frequency and amount.  Confusion.  Rapid breathing or pulse. SEEK IMMEDIATE MEDICAL CARE IF:   You have blood or brown flecks (like coffee grounds) in your vomit.  You have black or bloody stools.  You have a severe headache or stiff neck.  You are confused.  You have severe abdominal pain.  You have chest pain or trouble breathing.  You do not urinate at least once every 8 hours.  You develop cold or clammy skin.  You continue to vomit for longer than 24 to 48 hours.  You have a fever. MAKE SURE YOU:   Understand these instructions.  Will watch your condition.  Will get help right away if you are not doing well or get worse. Document Released: 03/16/2005 Document Revised: 06/08/2011 Document Reviewed: 08/13/2010 [facility] Patient Information 2014 [place], [place].

## 2012-09-05 NOTE — Progress Notes (Signed)
Patient ID: Marisa Fletcher, female   DOB: 29-Apr-1985, 27 y.o.   MRN: [id number] Marisa Fletcher [id number] 09-06-1985 09/05/2012      Progress Note-Follow Up  Subjective  Chief Complaint  Chief Complaint  Patient presents with  . Follow-up    2 month    HPI  Patient is a 27 year old Caucasian for followup. At her last visit she was switched from sertraline to Lexapro and she feels this is helping. She feels calmer and less anxious. Uses Klonopin infrequently. Denies any suicidal ideation. No side effects such as worsening GI upset or noted. No recent illness, chest pain, palpitations, shortness of breath, GI or GU complaints.  Past Medical History  Diagnosis Date  . Allergy   . Asthma   . Depression   . Depression with anxiety 04/17/2010    Qualifier: Diagnosis of  By: Abner Greenspan MD, Misty Stanley    . Preventative health care 07/09/2012    Past Surgical History  Procedure Laterality Date  . Cholecystectomy  2007    with pancreatitis  . Wisdom teeth extracted      Family History  Problem Relation Age of Onset  . Cholelithiasis Father   . Allergies Father   . Asthma Father   . Anxiety disorder Sister   . ADD / ADHD Brother     ADD  . Memory loss Maternal Grandmother   . Depression Maternal Grandmother     possible bipolar  . Obesity Maternal Grandmother   . Alcohol abuse Maternal Grandfather   . Cancer Paternal Grandmother   . Heart disease Paternal Grandmother   . Asthma Paternal Grandfather   . Allergies Paternal Grandfather   . Cholelithiasis Paternal Grandfather   . ADD / ADHD Sister     ADD  . Other Mother     Back disease    History   Social History  . Marital Status: Married    Spouse Name: N/A    Number of Children: N/A  . Years of Education: N/A   Occupational History  . Not on file.   Social History Main Topics  . Smoking status: Former Smoker -- 0.10 packs/day    Quit date: 12/28/2009  . Smokeless tobacco: Not on file  . Alcohol Use: 0.0 oz/week    0  drink(s) per week  . Drug Use: No  . Sexually Active:    Other Topics Concern  . Not on file   Social History Narrative  . No narrative on file    Current Outpatient Prescriptions on File Prior to Visit  Medication Sig Dispense Refill  . albuterol (PROVENTIL HFA) 108 (90 BASE) MCG/ACT inhaler Inhale 2 puffs into the lungs every 6 (six) hours as needed. 1-2 puffs q 4-6 hours prn for exercise/cough/wheeze/ SOB  18 g  3  . loratadine (CLARITIN) 10 MG tablet Take 1 tablet (10 mg total) by mouth daily.  30 tablet  5   No current facility-administered medications on file prior to visit.    No Known Allergies  Review of Systems  Review of Systems  Constitutional: Negative for fever and malaise/fatigue.  HENT: Negative for congestion.   Eyes: Negative for pain and discharge.  Respiratory: Negative for shortness of breath.   Cardiovascular: Negative for chest pain, palpitations and leg swelling.  Gastrointestinal: Positive for nausea. Negative for abdominal pain and diarrhea.  Genitourinary: Negative for dysuria.  Musculoskeletal: Negative for falls.  Skin: Negative for rash.  Neurological: Negative for loss of consciousness and headaches.  Endo/Heme/Allergies: Negative for polydipsia.  Psychiatric/Behavioral: Positive for depression. Negative for suicidal ideas. The patient is nervous/anxious. The patient does not have insomnia.     Objective  BP 98/72  Pulse 54  Temp(Src) 98.2 F (36.8 C) (Oral)  Ht 5' 4.25" (1.632 m)  Wt 165 lb 1.9 oz (74.898 kg)  BMI 28.12 kg/m2  SpO2 98%  LMP 08/26/2012  Physical Exam  Physical Exam  Constitutional: She is oriented to person, place, and time and well-developed, well-nourished, and in no distress. No distress.  HENT:  Head: Normocephalic and atraumatic.  Eyes: Conjunctivae are normal.  Neck: Neck supple. No thyromegaly present.  Cardiovascular: Normal rate, regular rhythm and normal heart sounds.   No murmur  heard. Pulmonary/Chest: Effort normal and breath sounds normal. She has no wheezes.  Abdominal: She exhibits no distension and no mass.  Musculoskeletal: She exhibits no edema.  Lymphadenopathy:    She has no cervical adenopathy.  Neurological: She is alert and oriented to person, place, and time.  Skin: Skin is warm and dry. No rash noted. She is not diaphoretic.  Psychiatric: Memory, affect and judgment normal.    Lab Results  Component Value Date   TSH 0.623 07/04/2012   Lab Results  Component Value Date   WBC 5.4 07/04/2012   HGB 13.4 07/04/2012   HCT 39.7 07/04/2012   MCV 84.6 07/04/2012   PLT 250 07/04/2012   Lab Results  Component Value Date   CREATININE 0.74 07/04/2012   BUN 10 07/04/2012   NA 139 07/04/2012   K 4.4 07/04/2012   CL 105 07/04/2012   CO2 28 07/04/2012   Lab Results  Component Value Date   ALT 14 07/04/2012   AST 19 07/04/2012   ALKPHOS 56 07/04/2012   BILITOT 0.4 07/04/2012   Lab Results  Component Value Date   CHOL 127 07/04/2012   Lab Results  Component Value Date   HDL 51 07/04/2012   Lab Results  Component Value Date   LDLCALC 65 07/04/2012   Lab Results  Component Value Date   TRIG 55 07/04/2012   Lab Results  Component Value Date   CHOLHDL 2.5 07/04/2012     Assessment & Plan  Depression with anxiety Improving with change to lexapro, continue same for now  ASTHMA, INTERMITTENT, MILD Uses albuterol infrequently with good results  OVERWEIGHT Discussed DASH diet and increased exercise, decrease po intake

## 2012-09-06 NOTE — Assessment & Plan Note (Signed)
Improving with change to lexapro, continue same for now

## 2012-09-06 NOTE — Assessment & Plan Note (Signed)
Uses albuterol infrequently with good results

## 2012-09-06 NOTE — Assessment & Plan Note (Signed)
Discussed DASH diet and increased exercise, decrease po intake

## 2013-10-22 ENCOUNTER — Encounter (HOSPITAL_BASED_OUTPATIENT_CLINIC_OR_DEPARTMENT_OTHER): Payer: Self-pay | Admitting: Emergency Medicine

## 2013-10-22 ENCOUNTER — Emergency Department (HOSPITAL_BASED_OUTPATIENT_CLINIC_OR_DEPARTMENT_OTHER)
Admission: EM | Admit: 2013-10-22 | Discharge: 2013-10-23 | Disposition: A | Discharge status: Home / Self Care | Payer: Commercial Indemnity | Attending: Emergency Medicine | Admitting: Emergency Medicine

## 2013-10-22 DIAGNOSIS — R1013 Epigastric pain: Secondary | ICD-10-CM | POA: Insufficient documentation

## 2013-10-22 DIAGNOSIS — R1031 Right lower quadrant pain: Secondary | ICD-10-CM | POA: Insufficient documentation

## 2013-10-22 DIAGNOSIS — Z3202 Encounter for pregnancy test, result negative: Secondary | ICD-10-CM | POA: Insufficient documentation

## 2013-10-22 DIAGNOSIS — R1032 Left lower quadrant pain: Secondary | ICD-10-CM | POA: Insufficient documentation

## 2013-10-22 DIAGNOSIS — F341 Dysthymic disorder: Secondary | ICD-10-CM | POA: Insufficient documentation

## 2013-10-22 DIAGNOSIS — R110 Nausea: Secondary | ICD-10-CM | POA: Insufficient documentation

## 2013-10-22 DIAGNOSIS — J45909 Unspecified asthma, uncomplicated: Secondary | ICD-10-CM | POA: Insufficient documentation

## 2013-10-22 DIAGNOSIS — Z87891 Personal history of nicotine dependence: Secondary | ICD-10-CM | POA: Insufficient documentation

## 2013-10-22 DIAGNOSIS — Z79899 Other long term (current) drug therapy: Secondary | ICD-10-CM | POA: Insufficient documentation

## 2013-10-22 LAB — BASIC METABOLIC PANEL
ANION GAP: 10 (ref 5–15)
BUN: 13 mg/dL (ref 6–23)
CALCIUM: 9.6 mg/dL (ref 8.4–10.5)
CO2: 26 mEq/L (ref 19–32)
Chloride: 102 mEq/L (ref 96–112)
Creatinine, Ser: 0.8 mg/dL (ref 0.50–1.10)
GFR calc Af Amer: >90 mL/min (ref >90)
Glucose, Bld: 97 mg/dL (ref 70–99)
POTASSIUM: 4.2 meq/L (ref 3.7–5.3)
Sodium: 138 mEq/L (ref 137–147)

## 2013-10-22 LAB — URINALYSIS, ROUTINE W REFLEX MICROSCOPIC
Bilirubin Urine: NEGATIVE
Glucose, UA: >1000 mg/dL — AB
Hgb urine dipstick: NEGATIVE
Ketones, ur: NEGATIVE mg/dL
Leukocytes, UA: NEGATIVE
Nitrite: NEGATIVE
Protein, ur: NEGATIVE mg/dL
Specific Gravity, Urine: 1.016 (ref 1.005–1.030)
Urobilinogen, UA: 0.2 mg/dL (ref 0.0–1.0)
pH: 6.5 (ref 5.0–8.0)

## 2013-10-22 LAB — URINE MICROSCOPIC-ADD ON

## 2013-10-22 LAB — PREGNANCY, URINE: Preg Test, Ur: NEGATIVE

## 2013-10-22 MED ORDER — GI COCKTAIL ~~LOC~~
30.0000 mL | Freq: Once | ORAL | Status: AC
  Administered 2013-10-22: 30 mL via ORAL
  Filled 2013-10-22: qty 30

## 2013-10-22 NOTE — ED Notes (Signed)
Pt states that for 6 months she has been vomiting and havigng lower abdominal pain. Pt states that it doesn't matter what time of days he still vomits. Pt states that her family has finally got her to come in due to unable to work and continue daily routine.

## 2013-10-23 LAB — LIPASE, BLOOD: LIPASE: 63 U/L — AB (ref 11–59)

## 2013-10-23 MED ORDER — HYDROCODONE-ACETAMINOPHEN 5-325 MG PO TABS: 1.0000 | ORAL_TABLET | ORAL | Status: DC | PRN

## 2013-10-23 MED ORDER — OMEPRAZOLE 20 MG PO CPDR: 20.0000 mg | DELAYED_RELEASE_CAPSULE | Freq: Every day | ORAL | Status: DC

## 2013-10-23 MED ORDER — HYDROCODONE-ACETAMINOPHEN 5-325 MG PO TABS
1.0000 | ORAL_TABLET | Freq: Once | ORAL | Status: AC
  Administered 2013-10-23: 1 via ORAL
  Filled 2013-10-23: qty 1

## 2013-10-23 NOTE — ED Provider Notes (Signed)
CSN: [id number]     Arrival date & time 10/22/13  2133 History   First MD Initiated Contact with Patient 10/22/13 2240     Chief Complaint  Patient presents with  . Abdominal Pain     (Consider location/radiation/quality/duration/timing/severity/associated sxs/prior Treatment) Patient is a 28 y.o. female presenting with abdominal pain. The history is provided by the patient and a parent. No language interpreter was used.  Abdominal Pain Pain location:  Epigastric, LLQ and RLQ Pain quality: aching and cramping   Associated symptoms: nausea   Associated symptoms: no chills, no constipation, no diarrhea, no dysuria, no fever and no vomiting   Associated symptoms comment:  She has been having pain that is greater in the epigastrium and lesser in the lower quadrants for about a year, worse at times than others. No fever. She has nausea sometimes, without vomiting. No change in bowel movement.    Past Medical History  Diagnosis Date  . Allergy   . Asthma   . Depression   . Depression with anxiety 04/17/2010    Qualifier: Diagnosis of  By: Abner GreenspanBlyth MD, Misty StanleyStacey    . Preventative health care 07/09/2012   Past Surgical History  Procedure Laterality Date  . Cholecystectomy  2007    with pancreatitis  . Wisdom teeth extracted     Family History  Problem Relation Age of Onset  . Cholelithiasis Father   . Allergies Father   . Asthma Father   . Anxiety disorder Sister   . ADD / ADHD Brother     ADD  . Memory loss Maternal Grandmother   . Depression Maternal Grandmother     possible bipolar  . Obesity Maternal Grandmother   . Alcohol abuse Maternal Grandfather   . Cancer Paternal Grandmother   . Heart disease Paternal Grandmother   . Asthma Paternal Grandfather   . Allergies Paternal Grandfather   . Cholelithiasis Paternal Grandfather   . ADD / ADHD Sister     ADD  . Other Mother     Back disease   History  Substance Use Topics  . Smoking status: Former Smoker -- 0.10 packs/day     Quit date: 12/28/2009  . Smokeless tobacco: Not on file  . Alcohol Use: 0.0 oz/week    0 drink(s) per week   OB History   Grav Para Term Preterm Abortions TAB SAB Ect Mult Living                 Review of Systems  Constitutional: Negative for fever and chills.  Gastrointestinal: Positive for nausea and abdominal pain. Negative for vomiting, diarrhea and constipation.  Genitourinary: Negative for dysuria.  Musculoskeletal: Negative.  Negative for back pain.  Skin: Negative.       Allergies  Review of patient's allergies indicates no known allergies.  Home Medications   Prior to Admission medications   Medication Sig Start Date End Date Taking? Authorizing Provider  albuterol (PROVENTIL HFA) 108 (90 BASE) MCG/ACT inhaler Inhale 2 puffs into the lungs every 6 (six) hours as needed. 1-2 puffs q 4-6 hours prn for exercise/cough/wheeze/ SOB 07/04/12  Yes Bradd CanaryStacey A Blyth, MD  clonazePAM (KLONOPIN) 0.5 MG tablet Take 1 tablet (0.5 mg total) by mouth as needed. For anxiety 09/05/12  Yes Bradd CanaryStacey A Blyth, MD  escitalopram (LEXAPRO) 10 MG tablet Take 1 tablet (10 mg total) by mouth daily. 09/05/12  Yes Bradd CanaryStacey A Blyth, MD  loratadine (CLARITIN) 10 MG tablet Take 1 tablet (10 mg total) by mouth daily.  07/04/12  Yes Bradd Canary, MD   BP 119/78  Pulse 60  Temp(Src) 98.1 F (36.7 C) (Oral)  Resp 22  Ht 5\' 4"  (1.626 m)  Wt 184 lb (83.462 kg)  BMI 31.57 kg/m2  SpO2 98%  LMP 09/13/2013 Physical Exam  Constitutional: She is oriented to person, place, and time. She appears well-developed and well-nourished.  HENT:  Head: Normocephalic.  Neck: Normal range of motion. Neck supple.  Pulmonary/Chest: Effort normal.  Abdominal: Soft. Bowel sounds are normal. There is no rebound and no guarding.  Moderate epigastric tenderness. No specific LUQ or RUQ tenderness. Lower abdomen is mildly ttp. No distention. BS positive.   Musculoskeletal: Normal range of motion.  Neurological: She is alert and  oriented to person, place, and time.  Skin: Skin is warm and dry. No rash noted.  Psychiatric: She has a normal mood and affect.    ED Course  Procedures (including critical care time) Labs Review Labs Reviewed  URINALYSIS, ROUTINE W REFLEX MICROSCOPIC - Abnormal; Notable for the following:    Glucose, UA >1000 (*)    All other components within normal limits  URINE MICROSCOPIC-ADD ON - Abnormal; Notable for the following:    Squamous Epithelial / LPF FEW (*)    Bacteria, UA FEW (*)    All other components within normal limits  PREGNANCY, URINE  BASIC METABOLIC PANEL  LIPASE, BLOOD    Imaging Review No results found.   EKG Interpretation None      MDM   Final diagnoses:  None    1. Abdominal pain  GI Cocktail provided very little relief. Further discussion with the patient reveals a history of pancreatitis that felt similar. Previous episode was caused by gall stones. Discussed that pain that has been present for one year is unlikely to be pancreas pain but lipase was added to lab studies today for evaluation.   Some mild relief with GI Cocktail. Will Rx prilosec to see if over time this relieves her discomfort.  PCP resources will be provided and she is encouraged to follow up for further management and evaluation.     Arnoldo Hooker, PA-C 10/23/13 0025

## 2013-10-23 NOTE — Discharge Instructions (Signed)

## 2013-10-26 NOTE — ED Provider Notes (Signed)
Medical screening examination/treatment/procedure(s) were performed by non-physician practitioner and as supervising physician I was immediately available for consultation/collaboration.   EKG Interpretation None        Rolan BuccoMelanie Petrina Melby, MD 10/26/13 [phone]

## 2014-02-27 ENCOUNTER — Ambulatory Visit (INDEPENDENT_AMBULATORY_CARE_PROVIDER_SITE_OTHER): Payer: Self-pay | Admitting: Family Medicine

## 2014-02-27 ENCOUNTER — Encounter: Payer: Self-pay | Admitting: Family Medicine

## 2014-02-27 VITALS — BP 134/76 | HR 64 | Temp 98.4°F | Ht 64.25 in | Wt 194.8 lb

## 2014-02-27 DIAGNOSIS — E663 Overweight: Secondary | ICD-10-CM

## 2014-02-27 DIAGNOSIS — F329 Major depressive disorder, single episode, unspecified: Secondary | ICD-10-CM

## 2014-02-27 DIAGNOSIS — F419 Anxiety disorder, unspecified: Secondary | ICD-10-CM

## 2014-02-27 DIAGNOSIS — K219 Gastro-esophageal reflux disease without esophagitis: Secondary | ICD-10-CM

## 2014-02-27 DIAGNOSIS — R350 Frequency of micturition: Secondary | ICD-10-CM

## 2014-02-27 DIAGNOSIS — R81 Glycosuria: Secondary | ICD-10-CM

## 2014-02-27 DIAGNOSIS — G43809 Other migraine, not intractable, without status migrainosus: Secondary | ICD-10-CM

## 2014-02-27 DIAGNOSIS — R109 Unspecified abdominal pain: Secondary | ICD-10-CM

## 2014-02-27 DIAGNOSIS — R112 Nausea with vomiting, unspecified: Secondary | ICD-10-CM

## 2014-02-27 DIAGNOSIS — F418 Other specified anxiety disorders: Secondary | ICD-10-CM

## 2014-02-27 DIAGNOSIS — F32A Depression, unspecified: Secondary | ICD-10-CM

## 2014-02-27 LAB — GLUCOSE, POCT (MANUAL RESULT ENTRY): POC GLUCOSE: 91 mg/dL (ref 70–99)

## 2014-02-27 MED ORDER — CITALOPRAM HYDROBROMIDE 10 MG PO TABS: 10.0000 mg | ORAL_TABLET | Freq: Every day | ORAL | Status: DC

## 2014-02-27 MED ORDER — CLONAZEPAM 0.5 MG PO TABS
0.5000 mg | ORAL_TABLET | ORAL | Status: DC | PRN
Start: 2014-02-27 — End: 2014-06-18

## 2014-02-27 MED ORDER — RANITIDINE HCL 300 MG PO CAPS: 300.0000 mg | ORAL_CAPSULE | Freq: Every evening | ORAL | Status: DC

## 2014-02-27 NOTE — Progress Notes (Signed)
Pre visit review using our clinic review tool, if applicable. No additional management support is needed unless otherwise documented below in the visit note. 

## 2014-02-27 NOTE — Patient Instructions (Signed)
   Start a probiotic such as [employer]age or [employer] Helicobacter Pylori Antibodies Test This is a blood test which looks for a germ called Helicobacter pylori. This can also be diagnosed by a breath test or a microscopic examination of a portion (biopsy) of the small bowel. H. pylori is a germ that is found in the cells that line the stomach. It is a risk factor for stomach and small bowel ulcers, long-standing inflammation of the lining of the stomach, or even ulcers that may occur in the esophagus (the canal that runs from the mouth to the stomach). This bacterium is also a factor in stomach cancer. The amount of the bacteria is found in about 10% of healthy persons younger than 27 years of age and the amount of the bacteria increases with age. Most persons with these bacteria have no symptoms; however, it is thought that when these bacteria cause ulcers, antibiotic medications can be used to help eliminate or reduce the problem.  PREPARATION FOR TEST No preparation or fasting is necessary. NORMAL FINDINGS Negative (H. pylori bacteria not present). Ranges for normal findings may vary among different laboratories and hospitals. You should always check with your doctor after having lab work or other tests done to discuss the meaning of your test results and whether or not your values are considered within normal limits. MEANING OF TEST  Your caregiver will go over the test results with you and discuss the importance and meaning of your results, as well as treatment options and the need for additional tests if necessary. OBTAINING THE TEST RESULTS It is your responsibility to obtain your test results. Ask the lab or department performing the test when and how you will get your results. Document Released: 04/09/2004 Document Revised: 07/31/2013 Document Reviewed: 02/25/2008 [facility] Patient Information 2015 [place]. This information is not intended to replace advice given  to you by your health care provider. Make sure you discuss any questions you have with your health care provider.

## 2014-02-28 LAB — URINALYSIS
BILIRUBIN URINE: NEGATIVE
Hgb urine dipstick: NEGATIVE
KETONES UR: NEGATIVE
Leukocytes, UA: NEGATIVE
Nitrite: NEGATIVE
Specific Gravity, Urine: 1.01 (ref 1.000–1.030)
TOTAL PROTEIN, URINE-UPE24: NEGATIVE
UROBILINOGEN UA: 0.2 (ref 0.0–1.0)
Urine Glucose: 250 — AB
pH: 6 (ref 5.0–8.0)

## 2014-02-28 LAB — H. PYLORI ANTIBODY, IGG: H Pylori IgG: NEGATIVE

## 2014-02-28 LAB — LIPASE: Lipase: 50 U/L (ref 11.0–59.0)

## 2014-03-04 ENCOUNTER — Encounter: Payer: Self-pay | Admitting: Family Medicine

## 2014-03-04 NOTE — Progress Notes (Signed)
Marisa DemarkMegan Fletcher  [id number] 04/06/1985 03/04/2014      Progress Note-Follow Up  Subjective  Chief Complaint  Chief Complaint  Patient presents with  . stomach issues    HPI  Patient is a 28 y.o. female in today for routine medical care. Patient is in today is complaining of abdominal pain, diffuse and worsening over past month. Is having intermittent sharp pains but is not sure if it correlates with eating. No bloody or tarry stool. Does note dyspepsia nd reflux. Notes worsening anxiety and depression over past several months. No suicidal ideation but anhedonia and difficulty concentrating is noted.  Past Medical History  Diagnosis Date  . Allergy   . Asthma   . Depression   . Depression with anxiety 04/17/2010    Qualifier: Diagnosis of  By: Abner GreenspanBlyth MD, Misty StanleyStacey    . Preventative health care 07/09/2012    Past Surgical History  Procedure Laterality Date  . Cholecystectomy  2007    with pancreatitis  . Wisdom teeth extracted      Family History  Problem Relation Age of Onset  . Cholelithiasis Father   . Allergies Father   . Asthma Father   . Anxiety disorder Sister   . ADD / ADHD Brother     ADD  . Memory loss Maternal Grandmother   . Depression Maternal Grandmother     possible bipolar  . Obesity Maternal Grandmother   . Alcohol abuse Maternal Grandfather   . Cancer Paternal Grandmother   . Heart disease Paternal Grandmother   . Asthma Paternal Grandfather   . Allergies Paternal Grandfather   . Cholelithiasis Paternal Grandfather   . ADD / ADHD Sister     ADD  . Other Mother     Back disease    History   Social History  . Marital Status: Married    Spouse Name: N/A    Number of Children: N/A  . Years of Education: N/A   Occupational History  . Not on file.   Social History Main Topics  . Smoking status: Former Smoker -- 0.10 packs/day    Quit date: 12/28/2009  . Smokeless tobacco: Not on file  . Alcohol Use: 0.0 oz/week    0 drink(s) per week  .  Drug Use: No  . Sexual Activity: Not on file   Other Topics Concern  . Not on file   Social History Narrative    Current Outpatient Prescriptions on File Prior to Visit  Medication Sig Dispense Refill  . albuterol (PROVENTIL HFA) 108 (90 BASE) MCG/ACT inhaler Inhale 2 puffs into the lungs every 6 (six) hours as needed. 1-2 puffs q 4-6 hours prn for exercise/cough/wheeze/ SOB 18 g 3  . loratadine (CLARITIN) 10 MG tablet Take 1 tablet (10 mg total) by mouth daily. 30 tablet 5   No current facility-administered medications on file prior to visit.    No Known Allergies  Review of Systems  Review of Systems  Constitutional: Negative for fever, chills and malaise/fatigue.  HENT: Negative for congestion, hearing loss and nosebleeds.   Eyes: Negative for discharge.  Respiratory: Negative for cough, sputum production, shortness of breath and wheezing.   Cardiovascular: Negative for chest pain, palpitations and leg swelling.  Gastrointestinal: Positive for heartburn, nausea and abdominal pain. Negative for vomiting, diarrhea, constipation and blood in stool.  Genitourinary: Negative for dysuria, urgency, frequency and hematuria.  Musculoskeletal: Negative for myalgias, back pain and falls.  Skin: Negative for rash.  Neurological: Negative for dizziness, tremors,  sensory change, focal weakness, loss of consciousness, weakness and headaches.  Endo/Heme/Allergies: Negative for polydipsia. Does not bruise/bleed easily.  Psychiatric/Behavioral: Positive for depression. Negative for suicidal ideas. The patient is nervous/anxious. The patient does not have insomnia.     Objective  BP 134/76 mmHg  Pulse 64  Temp(Src) 98.4 F (36.9 C) (Oral)  Ht 5' 4.25" (1.632 m)  Wt 194 lb 12.8 oz (88.361 kg)  BMI 33.18 kg/m2  SpO2 99%  LMP 01/28/2014  Physical Exam  Physical Exam  Constitutional: She is oriented to person, place, and time and well-developed, well-nourished, and in no distress. No  distress.  HENT:  Head: Normocephalic and atraumatic.  Eyes: Conjunctivae are normal.  Neck: Neck supple. No thyromegaly present.  Cardiovascular: Normal rate, regular rhythm and normal heart sounds.   No murmur heard. Pulmonary/Chest: Effort normal and breath sounds normal. She has no wheezes.  Abdominal: She exhibits no distension and no mass.  Musculoskeletal: She exhibits no edema.  Lymphadenopathy:    She has no cervical adenopathy.  Neurological: She is alert and oriented to person, place, and time.  Skin: Skin is warm and dry. No rash noted. She is not diaphoretic.  Psychiatric: Memory, affect and judgment normal.    Lab Results  Component Value Date   TSH 0.623 07/04/2012   Lab Results  Component Value Date   WBC 5.4 07/04/2012   HGB 13.4 07/04/2012   HCT 39.7 07/04/2012   MCV 84.6 07/04/2012   PLT 250 07/04/2012   Lab Results  Component Value Date   CREATININE 0.80 10/22/2013   BUN 13 10/22/2013   NA 138 10/22/2013   K 4.2 10/22/2013   CL 102 10/22/2013   CO2 26 10/22/2013   Lab Results  Component Value Date   ALT 14 07/04/2012   AST 19 07/04/2012   ALKPHOS 56 07/04/2012   BILITOT 0.4 07/04/2012   Lab Results  Component Value Date   CHOL 127 07/04/2012   Lab Results  Component Value Date   HDL 51 07/04/2012   Lab Results  Component Value Date   LDLCALC 65 07/04/2012   Lab Results  Component Value Date   TRIG 55 07/04/2012   Lab Results  Component Value Date   CHOLHDL 2.5 07/04/2012     Assessment & Plan  GERD Avoid offending foods, start probiotics. Do not eat large meals in late evening and consider raising head of bed. Check H PYlori and start Ranitidine. Consider further work up if symptoms worsen  Depression with anxiety Agrees to start Citalopram and may use Clonazepam prn. Consider counseling  Overweight Encouraged DASH diet, decrease po intake and increase exercise as tolerated. Needs 7-8 hours of sleep nightly. Avoid trans  fats, eat small, frequent meals every 4-5 hours with lean proteins, complex carbs and healthy fats. Minimize simple carbs, GMO foods.

## 2014-03-04 NOTE — Assessment & Plan Note (Signed)
Agrees to start Citalopram and may use Clonazepam prn. Consider counseling

## 2014-03-04 NOTE — Assessment & Plan Note (Addendum)
Avoid offending foods, start probiotics. Do not eat large meals in late evening and consider raising head of bed. Check H PYlori and start Ranitidine. Consider further work up if symptoms worsen

## 2014-03-04 NOTE — Assessment & Plan Note (Signed)
Encouraged DASH diet, decrease po intake and increase exercise as tolerated. Needs 7-8 hours of sleep nightly. Avoid trans fats, eat small, frequent meals every 4-5 hours with lean proteins, complex carbs and healthy fats. Minimize simple carbs, GMO foods. 

## 2014-03-07 ENCOUNTER — Encounter: Payer: Self-pay | Admitting: Family Medicine

## 2014-04-09 ENCOUNTER — Encounter: Payer: Self-pay | Admitting: Family Medicine

## 2014-04-09 ENCOUNTER — Ambulatory Visit (INDEPENDENT_AMBULATORY_CARE_PROVIDER_SITE_OTHER): Payer: BLUE CROSS/BLUE SHIELD | Admitting: *Deleted

## 2014-04-09 ENCOUNTER — Ambulatory Visit (INDEPENDENT_AMBULATORY_CARE_PROVIDER_SITE_OTHER): Payer: BLUE CROSS/BLUE SHIELD | Admitting: Family Medicine

## 2014-04-09 VITALS — BP 125/84 | HR 70 | Temp 98.3°F | Ht 64.25 in | Wt 195.8 lb

## 2014-04-09 DIAGNOSIS — Z23 Encounter for immunization: Secondary | ICD-10-CM

## 2014-04-09 DIAGNOSIS — K589 Irritable bowel syndrome without diarrhea: Secondary | ICD-10-CM

## 2014-04-09 DIAGNOSIS — K219 Gastro-esophageal reflux disease without esophagitis: Secondary | ICD-10-CM

## 2014-04-09 DIAGNOSIS — R81 Glycosuria: Secondary | ICD-10-CM

## 2014-04-09 DIAGNOSIS — R112 Nausea with vomiting, unspecified: Secondary | ICD-10-CM

## 2014-04-09 DIAGNOSIS — Z91018 Allergy to other foods: Secondary | ICD-10-CM

## 2014-04-09 DIAGNOSIS — R5383 Other fatigue: Secondary | ICD-10-CM

## 2014-04-09 DIAGNOSIS — Z91014 Allergy to mammalian meats: Secondary | ICD-10-CM

## 2014-04-09 DIAGNOSIS — F418 Other specified anxiety disorders: Secondary | ICD-10-CM

## 2014-04-09 DIAGNOSIS — R1084 Generalized abdominal pain: Secondary | ICD-10-CM

## 2014-04-09 LAB — TSH: TSH: 0.67 u[IU]/mL (ref 0.35–4.50)

## 2014-04-09 LAB — CBC
HEMATOCRIT: 41.4 % (ref 36.0–46.0)
HEMOGLOBIN: 13.4 g/dL (ref 12.0–15.0)
MCHC: 32.4 g/dL (ref 30.0–36.0)
MCV: 87 fl (ref 78.0–100.0)
PLATELETS: 304 10*3/uL (ref 150.0–400.0)
RBC: 4.76 Mil/uL (ref 3.87–5.11)
RDW: 12.7 % (ref 11.5–15.5)
WBC: 5.9 10*3/uL (ref 4.0–10.5)

## 2014-04-09 LAB — COMPREHENSIVE METABOLIC PANEL
ALK PHOS: 61 U/L (ref 39–117)
ALT: 19 U/L (ref 0–35)
AST: 21 U/L (ref 0–37)
Albumin: 4.3 g/dL (ref 3.5–5.2)
BILIRUBIN TOTAL: 0.4 mg/dL (ref 0.2–1.2)
BUN: 10 mg/dL (ref 6–23)
CALCIUM: 9.4 mg/dL (ref 8.4–10.5)
CO2: 25 mEq/L (ref 19–32)
CREATININE: 0.8 mg/dL (ref 0.4–1.2)
Chloride: 105 mEq/L (ref 96–112)
GFR: 89.81 mL/min (ref >60.00)
Glucose, Bld: 92 mg/dL (ref 70–99)
Potassium: 4.1 mEq/L (ref 3.5–5.1)
SODIUM: 137 meq/L (ref 135–145)
Total Protein: 8.1 g/dL (ref 6.0–8.3)

## 2014-04-09 LAB — LIPASE: Lipase: 41 U/L (ref 11.0–59.0)

## 2014-04-09 MED ORDER — CITALOPRAM HYDROBROMIDE 10 MG PO TABS: 10.0000 mg | ORAL_TABLET | Freq: Every day | ORAL | Status: DC

## 2014-04-09 NOTE — Patient Instructions (Signed)
Switch probiotic to [employer]age or [employer] daily Psyllium husk daily, in juice  Start a probiotic such as [employer]age or [employer]

## 2014-04-09 NOTE — Progress Notes (Signed)
Pre visit review using our clinic review tool, if applicable. No additional management support is needed unless otherwise documented below in the visit note. 

## 2014-04-14 LAB — ALLERGEN MILK

## 2014-04-15 ENCOUNTER — Encounter: Payer: Self-pay | Admitting: Family Medicine

## 2014-04-15 DIAGNOSIS — R81 Glycosuria: Secondary | ICD-10-CM

## 2014-04-15 DIAGNOSIS — Z91014 Allergy to mammalian meats: Secondary | ICD-10-CM

## 2014-04-15 DIAGNOSIS — Z91018 Allergy to other foods: Secondary | ICD-10-CM | POA: Insufficient documentation

## 2014-04-15 HISTORY — DX: Glycosuria: R81

## 2014-04-15 HISTORY — DX: Allergy to mammalian meats: Z91.014

## 2014-04-15 NOTE — Progress Notes (Signed)
Marisa DemarkMegan Fletcher  [id number] 05/17/1985 04/15/2014      Progress Note-Follow Up  Subjective  Chief Complaint  Chief Complaint  Patient presents with  . Follow-up    6 weeks    HPI   Patient is a 29 y.o. female in today for routine medical care. Patient in today with persistent abdominal symptoms. Intermittent episodes of cramping, nausea, vomiting, abdominal pain. Occurs more intensely after eating. Complains of bloating and heartburn. No other recent illness. Denies CP/palp/SOB/HA/congestion/fevers/GI or GU c/o. Taking meds as prescribed  Past Medical History  Diagnosis Date  . Allergy   . Asthma   . Depression   . Depression with anxiety 04/17/2010    Qualifier: Diagnosis of  By: Abner GreenspanBlyth MD, Misty StanleyStacey    . Preventative health care 07/09/2012  . 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 Cholecystectomy  2007    with pancreatitis  . Wisdom teeth extracted      Family History  Problem Relation Age of Onset  . Cholelithiasis Father   . Allergies Father   . Asthma Father   . Anxiety disorder Sister   . ADD / ADHD Brother     ADD  . Memory loss Maternal Grandmother   . Depression Maternal Grandmother     possible bipolar  . Obesity Maternal Grandmother   . Alcohol abuse Maternal Grandfather   . Cancer Paternal Grandmother   . Heart disease Paternal Grandmother   . Asthma Paternal Grandfather   . Allergies Paternal Grandfather   . Cholelithiasis Paternal Grandfather   . ADD / ADHD Sister     ADD  . Other Mother     Back disease    History   Social History  . Marital Status: Married    Spouse Name: N/A    Number of Children: N/A  . Years of Education: N/A   Occupational History  . Not on file.   Social History Main Topics  . Smoking status: Former Smoker -- 0.10 packs/day    Quit date: 12/28/2009  . Smokeless tobacco: Not on file  . Alcohol Use: 0.0 oz/week    0 drink(s) per week  . Drug Use:  No  . Sexual Activity: Not on file   Other Topics Concern  . Not on file   Social History Narrative    Current Outpatient Prescriptions on File Prior to Visit  Medication Sig Dispense Refill  . albuterol (PROVENTIL HFA) 108 (90 BASE) MCG/ACT inhaler Inhale 2 puffs into the lungs every 6 (six) hours as needed. 1-2 puffs q 4-6 hours prn for exercise/cough/wheeze/ SOB 18 g 3  . clonazePAM (KLONOPIN) 0.5 MG tablet Take 1 tablet (0.5 mg total) by mouth as needed. For anxiety 30 tablet 3  . loratadine (CLARITIN) 10 MG tablet Take 1 tablet (10 mg total) by mouth daily. 30 tablet 5  . ranitidine (ZANTAC) 300 MG capsule Take 1 capsule (300 mg total) by mouth every evening. 30 capsule 2   No current facility-administered medications on file prior to visit.    No Known Allergies  Review of Systems  Review of Systems  Constitutional: Negative for fever and malaise/fatigue.  HENT: Negative for congestion.   Eyes: Negative for discharge.  Respiratory: Negative for shortness of breath.   Cardiovascular: Negative for chest pain, palpitations and leg swelling.  Gastrointestinal: Positive for heartburn, nausea, vomiting, abdominal pain, diarrhea and constipation. Negative for blood in stool and melena.  Genitourinary: Negative for dysuria.  Musculoskeletal: Negative for falls.  Skin: Negative for rash.  Neurological: Negative for loss of consciousness and headaches.  Endo/Heme/Allergies: Negative for polydipsia.  Psychiatric/Behavioral: Negative for depression and suicidal ideas. The patient is not nervous/anxious and does not have insomnia.     Objective  BP 125/84 mmHg  Pulse 70  Temp(Src) 98.3 F (36.8 C) (Oral)  Ht 5' 4.25" (1.632 m)  Wt 195 lb 12.8 oz (88.814 kg)  BMI 33.35 kg/m2  SpO2 99%  LMP 03/28/2014  Physical Exam  Physical Exam  Constitutional: She is oriented to person, place, and time and well-developed, well-nourished, and in no distress. No distress.  HENT:  Head:  Normocephalic and atraumatic.  Eyes: Conjunctivae are normal.  Neck: Neck supple. No thyromegaly present.  Cardiovascular: Normal rate, regular rhythm and normal heart sounds.   No murmur heard. Pulmonary/Chest: Effort normal and breath sounds normal. She has no wheezes.  Abdominal: She exhibits no distension and no mass.  Musculoskeletal: She exhibits no edema.  Lymphadenopathy:    She has no cervical adenopathy.  Neurological: She is alert and oriented to person, place, and time.  Skin: Skin is warm and dry. No rash noted. She is not diaphoretic.  Psychiatric: Memory, affect and judgment normal.    Lab Results  Component Value Date   TSH 0.67 04/09/2014   Lab Results  Component Value Date   WBC 5.9 04/09/2014   HGB 13.4 04/09/2014   HCT 41.4 04/09/2014   MCV 87.0 04/09/2014   PLT 304.0 04/09/2014   Lab Results  Component Value Date   CREATININE 0.8 04/09/2014   BUN 10 04/09/2014   NA 137 04/09/2014   K 4.1 04/09/2014   CL 105 04/09/2014   CO2 25 04/09/2014   Lab Results  Component Value Date   ALT 19 04/09/2014   AST 21 04/09/2014   ALKPHOS 61 04/09/2014   BILITOT 0.4 04/09/2014   Lab Results  Component Value Date   CHOL 127 07/04/2012   Lab Results  Component Value Date   HDL 51 07/04/2012   Lab Results  Component Value Date   LDLCALC 65 07/04/2012   Lab Results  Component Value Date   TRIG 55 07/04/2012   Lab Results  Component Value Date   CHOLHDL 2.5 07/04/2012     Assessment & Plan  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

## 2014-04-15 NOTE — Assessment & Plan Note (Signed)
She feels Celexa is working adequately, no changes

## 2014-04-15 NOTE — Assessment & Plan Note (Signed)
Struggling with GI upset, is encouraged to avoid offending foods

## 2014-04-16 LAB — HEMOGLOBIN A1C: Hgb A1c MFr Bld: 5.3 % (ref 4.6–6.5)

## 2014-04-17 ENCOUNTER — Encounter: Payer: Self-pay | Admitting: *Deleted

## 2014-04-20 LAB — IGG FOOD PANEL
ALLERGEN EGG WHITE IGG: 6.5 ug/mL — AB (ref <2.0)
ALLERGEN EGG YOLK IGG: 4.8 ug/mL — AB (ref <2.0)
ALLERGEN PEANUT IGG: 0.28 ug/mL — AB (ref <0.15)
ALLERGEN WHEAT IGG: 2.78 ug/mL — AB (ref <0.15)
Beef, IgG: 6.6 ug/mL — ABNORMAL HIGH (ref <2.0)
Corn, IgG: <0.15 ug/mL (ref <0.15)

## 2014-04-23 ENCOUNTER — Other Ambulatory Visit: Payer: BLUE CROSS/BLUE SHIELD

## 2014-06-18 ENCOUNTER — Encounter: Payer: Self-pay | Admitting: Family Medicine

## 2014-06-18 ENCOUNTER — Telehealth: Payer: Self-pay | Admitting: Family Medicine

## 2014-06-18 ENCOUNTER — Ambulatory Visit (INDEPENDENT_AMBULATORY_CARE_PROVIDER_SITE_OTHER): Payer: BLUE CROSS/BLUE SHIELD | Admitting: Family Medicine

## 2014-06-18 VITALS — BP 120/80 | HR 71 | Temp 98.0°F | Resp 16 | Wt 195.2 lb

## 2014-06-18 DIAGNOSIS — Z91014 Allergy to mammalian meats: Secondary | ICD-10-CM

## 2014-06-18 DIAGNOSIS — E663 Overweight: Secondary | ICD-10-CM | POA: Diagnosis not present

## 2014-06-18 DIAGNOSIS — F418 Other specified anxiety disorders: Secondary | ICD-10-CM

## 2014-06-18 DIAGNOSIS — F32A Depression, unspecified: Secondary | ICD-10-CM

## 2014-06-18 DIAGNOSIS — F419 Anxiety disorder, unspecified: Secondary | ICD-10-CM

## 2014-06-18 DIAGNOSIS — K219 Gastro-esophageal reflux disease without esophagitis: Secondary | ICD-10-CM | POA: Diagnosis not present

## 2014-06-18 DIAGNOSIS — Z91018 Allergy to other foods: Secondary | ICD-10-CM | POA: Diagnosis not present

## 2014-06-18 DIAGNOSIS — F329 Major depressive disorder, single episode, unspecified: Secondary | ICD-10-CM

## 2014-06-18 MED ORDER — CLONAZEPAM 0.5 MG PO TABS: 0.5000 mg | ORAL_TABLET | ORAL | Status: DC | PRN

## 2014-06-18 MED ORDER — ANTIPYRINE-BENZOCAINE 5.4-1.4 % OT SOLN: 3.0000 [drp] | OTIC | Status: DC | PRN

## 2014-06-18 MED ORDER — FLUOXETINE HCL 10 MG PO TABS: 10.0000 mg | ORAL_TABLET | Freq: Every day | ORAL | Status: DC

## 2014-06-18 NOTE — Telephone Encounter (Signed)
Please check with [facility] and confirm this is no longer available and if there is any alternative that is similar I could prescribejess

## 2014-06-18 NOTE — Progress Notes (Signed)
Pre visit review using our clinic review tool, if applicable. No additional management support is needed unless otherwise documented below in the visit note. 

## 2014-06-18 NOTE — Telephone Encounter (Signed)
Caller name: [facility]   Reason for call:  As per pharmacy no longer makes this medication antipyrine-benzocaine (AURALGAN) otic solution, requesting an alternate. Please advise

## 2014-06-18 NOTE — Patient Instructions (Signed)
Generalized Anxiety Disorder Generalized anxiety disorder (GAD) is a mental disorder. It interferes with life functions, including relationships, work, and school. GAD is different from normal anxiety, which everyone experiences at some point in their lives in response to specific life events and activities. Normal anxiety actually helps us prepare for and get through these life events and activities. Normal anxiety goes away after the event or activity is over.  GAD causes anxiety that is not necessarily related to specific events or activities. It also causes excess anxiety in proportion to specific events or activities. The anxiety associated with GAD is also difficult to control. GAD can vary from mild to severe. People with severe GAD can have intense waves of anxiety with physical symptoms (panic attacks).  SYMPTOMS The anxiety and worry associated with GAD are difficult to control. This anxiety and worry are related to many life events and activities and also occur more days than not for 6 months or longer. People with GAD also have three or more of the following symptoms (one or more in children):  Restlessness.   Fatigue.  Difficulty concentrating.   Irritability.  Muscle tension.  Difficulty sleeping or unsatisfying sleep. DIAGNOSIS GAD is diagnosed through an assessment by your health care provider. Your health care provider will ask you questions aboutyour mood,physical symptoms, and events in your life. Your health care provider may ask you about your medical history and use of alcohol or drugs, including prescription medicines. Your health care provider may also do a physical exam and blood tests. Certain medical conditions and the use of certain substances can cause symptoms similar to those associated with GAD. Your health care provider may refer you to a mental health specialist for further evaluation. TREATMENT The following therapies are usually used to treat GAD:    Medication. Antidepressant medication usually is prescribed for long-term daily control. Antianxiety medicines may be added in severe cases, especially when panic attacks occur.   Talk therapy (psychotherapy). Certain types of talk therapy can be helpful in treating GAD by providing support, education, and guidance. A form of talk therapy called cognitive behavioral therapy can teach you healthy ways to think about and react to daily life events and activities.  Stress managementtechniques. These include yoga, meditation, and exercise and can be very helpful when they are practiced regularly. A mental health specialist can help determine which treatment is best for you. Some people see improvement with one therapy. However, other people require a combination of therapies. Document Released: 07/11/2012 Document Revised: 07/31/2013 Document Reviewed: 07/11/2012 Exit[employer] Patient Information 2015 [employer]. This information is not intended to replace advice given to you by your health care provider. Make sure you discuss any questions you have with your health care provider.  

## 2014-06-18 NOTE — Assessment & Plan Note (Addendum)
Did not tolerate Citalopram. Did well on Prozac for years but has ben off for several years. Will try and restart Prozac at 10 mg daily

## 2014-06-19 NOTE — Telephone Encounter (Signed)
FYI- please see below  Spoke with pharmacy, they confirmed it is no longer available.  They stated that there is no alternate strictly for pain unless you would like a steroid.

## 2014-06-19 NOTE — Telephone Encounter (Signed)
Left detailed message for patient regarding suggestion.  eal

## 2014-06-19 NOTE — Telephone Encounter (Signed)
Let her know it has been withdrawn from market which I was not aware of, have her try 3-5 drops of hydrogen peroxide in ears with pain or itching and let us know if worsens

## 2014-06-28 NOTE — Assessment & Plan Note (Signed)
Encouraged DASH diet, decrease po intake and increase exercise as tolerated. Needs 7-8 hours of sleep nightly. Avoid trans fats, eat small, frequent meals every 4-5 hours with lean proteins, complex carbs and healthy fats. Minimize simple carbs 

## 2014-06-28 NOTE — Assessment & Plan Note (Signed)
Notes a significant improvement in GI symptoms when she avoids offending foods.

## 2014-06-28 NOTE — Assessment & Plan Note (Signed)
Improving, Avoid offending foods, start probiotics. Do not eat large meals in late evening and consider raising head of bed.

## 2014-06-28 NOTE — Progress Notes (Signed)
Marisa Fletcher  [id number] [birth year] 06/28/2014      Progress Note-Follow Up  Subjective  Chief Complaint  Chief Complaint  Patient presents with  . Follow-up    GERD and allergies    HPI  Patient is a 29 y.o. female in today for routine medical care. She is here today in follow-up on numerous medical concerns. She notes that her dyspepsia and heartburn have greatly improved with probiotics and avoiding offending foods. Allergy testing revealed she has sensitivities to beef and headache and when avoiding them she is doing better. Ranitidine has also been helpful. She notes she did not tolerate citalopram caused nausea and insomnia. She has used fluoxetine in the past with reasonable results. Continues to struggle with anxiety and anhedonia but no suicidal ideation. No other acute complaints noted today. Denies CP/palp/SOB/HA/congestion/fevers/GI or GU c/o. Taking meds as prescribed  Past Medical History  Diagnosis Date  . Allergy   . Asthma   . Depression   . Depression with anxiety 04/17/2010    Qualifier: Diagnosis of  By: Abner Greenspan MD, Misty Stanley    . Preventative health care 07/09/2012  . 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 Cholecystectomy  2007    with pancreatitis  . Wisdom teeth extracted      Family History  Problem Relation Age of Onset  . Cholelithiasis Father   . Allergies Father   . Asthma Father   . Anxiety disorder Sister   . ADD / ADHD Brother     ADD  . Memory loss Maternal Grandmother   . Depression Maternal Grandmother     possible bipolar  . Obesity Maternal Grandmother   . Alcohol abuse Maternal Grandfather   . Cancer Paternal Grandmother   . Heart disease Paternal Grandmother   . Asthma Paternal Grandfather   . Allergies Paternal Grandfather   . Cholelithiasis Paternal Grandfather   . ADD / ADHD Sister     ADD  . Other Mother     Back disease    History   Social History  .  Marital Status: Married    Spouse Name: Marisa Fletcher  . Number of Children: Marisa Fletcher  . Years of Education: Marisa Fletcher   Occupational History  . Not on file.   Social History Main Topics  . Smoking status: Former Smoker -- 0.10 packs/day    Quit date: 12/28/2009  . Smokeless tobacco: Not on file  . Alcohol Use: 0.0 oz/week    0 drink(s) per week  . Drug Use: No  . Sexual Activity: Not on file   Other Topics Concern  . Not on file   Social History Narrative    Current Outpatient Prescriptions on File Prior to Visit  Medication Sig Dispense Refill  . albuterol (PROVENTIL HFA) 108 (90 BASE) MCG/ACT inhaler Inhale 2 puffs into the lungs every 6 (six) hours as needed. 1-2 puffs q 4-6 hours prn for exercise/cough/wheeze/ SOB 18 g 3  . loratadine (CLARITIN) 10 MG tablet Take 1 tablet (10 mg total) by mouth daily. 30 tablet 5  . ranitidine (ZANTAC) 300 MG capsule Take 1 capsule (300 mg total) by mouth every evening. 30 capsule 2   No current facility-administered medications on file prior to visit.    Allergies  Allergen Reactions  . Citalopram Nausea Only    insomnia    Review of Systems  Review of Systems  Constitutional: Negative for fever and malaise/fatigue.  HENT:  Negative for congestion.   Eyes: Negative for discharge.  Respiratory: Negative for shortness of breath.   Cardiovascular: Negative for chest pain, palpitations and leg swelling.  Gastrointestinal: Positive for heartburn. Negative for nausea, abdominal pain and diarrhea.  Genitourinary: Negative for dysuria.  Musculoskeletal: Negative for falls.  Skin: Negative for rash.  Neurological: Negative for loss of consciousness and headaches.  Endo/Heme/Allergies: Negative for polydipsia.  Psychiatric/Behavioral: Negative for depression and suicidal ideas. The patient is not nervous/anxious and does not have insomnia.     Objective  BP 120/80 mmHg  Pulse 71  Temp(Src) 98 F (36.7 C) (Oral)  Resp 16  Wt 195 lb 4 oz (88.565 kg)   SpO2 97%  Physical Exam  Physical Exam  Constitutional: She is oriented to person, place, and time and well-developed, well-nourished, and in no distress. No distress.  HENT:  Head: Normocephalic and atraumatic.  Eyes: Conjunctivae are normal.  Neck: Neck supple. No thyromegaly present.  Cardiovascular: Normal rate, regular rhythm and normal heart sounds.   No murmur heard. Pulmonary/Chest: Effort normal and breath sounds normal. She has no wheezes.  Abdominal: She exhibits no distension and no mass.  Musculoskeletal: She exhibits no edema.  Lymphadenopathy:    She has no cervical adenopathy.  Neurological: She is alert and oriented to person, place, and time.  Skin: Skin is warm and dry. No rash noted. She is not diaphoretic.  Psychiatric: Memory, affect and judgment normal.    Lab Results  Component Value Date   TSH 0.67 04/09/2014   Lab Results  Component Value Date   WBC 5.9 04/09/2014   HGB 13.4 04/09/2014   HCT 41.4 04/09/2014   MCV 87.0 04/09/2014   PLT 304.0 04/09/2014   Lab Results  Component Value Date   CREATININE 0.8 04/09/2014   BUN 10 04/09/2014   NA 137 04/09/2014   K 4.1 04/09/2014   CL 105 04/09/2014   CO2 25 04/09/2014   Lab Results  Component Value Date   ALT 19 04/09/2014   AST 21 04/09/2014   ALKPHOS 61 04/09/2014   BILITOT 0.4 04/09/2014   Lab Results  Component Value Date   CHOL 127 07/04/2012   Lab Results  Component Value Date   HDL 51 07/04/2012   Lab Results  Component Value Date   LDLCALC 65 07/04/2012   Lab Results  Component Value Date   TRIG 55 07/04/2012   Lab Results  Component Value Date   CHOLHDL 2.5 07/04/2012     Assessment & Plan  Depression with anxiety Did not tolerate Citalopram. Did well on Prozac for years but has ben off for several years. Will try and restart Prozac at 10 mg daily   GERD Improving, Avoid offending foods, start probiotics. Do not eat large meals in late evening and consider  raising head of bed.    Overweight Encouraged DASH diet, decrease po intake and increase exercise as tolerated. Needs 7-8 hours of sleep nightly. Avoid trans fats, eat small, frequent meals every 4-5 hours with lean proteins, complex carbs and healthy fats. 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

## 2014-09-03 ENCOUNTER — Ambulatory Visit (INDEPENDENT_AMBULATORY_CARE_PROVIDER_SITE_OTHER): Payer: BLUE CROSS/BLUE SHIELD | Admitting: Family Medicine

## 2014-09-03 ENCOUNTER — Encounter: Payer: Self-pay | Admitting: Family Medicine

## 2014-09-03 VITALS — BP 122/84 | HR 68 | Temp 98.7°F | Ht 64.0 in | Wt 194.1 lb

## 2014-09-03 DIAGNOSIS — K219 Gastro-esophageal reflux disease without esophagitis: Secondary | ICD-10-CM

## 2014-09-03 DIAGNOSIS — T7840XA Allergy, unspecified, initial encounter: Secondary | ICD-10-CM | POA: Diagnosis not present

## 2014-09-03 DIAGNOSIS — Z91018 Allergy to other foods: Secondary | ICD-10-CM

## 2014-09-03 DIAGNOSIS — J4520 Mild intermittent asthma, uncomplicated: Secondary | ICD-10-CM | POA: Diagnosis not present

## 2014-09-03 DIAGNOSIS — F418 Other specified anxiety disorders: Secondary | ICD-10-CM | POA: Diagnosis not present

## 2014-09-03 DIAGNOSIS — Z91014 Allergy to mammalian meats: Secondary | ICD-10-CM

## 2014-09-03 MED ORDER — EPINEPHRINE 0.3 MG/0.3ML IJ SOAJ
0.3000 mg | Freq: Once | INTRAMUSCULAR | Status: DC
Start: 2014-09-03 — End: 2017-12-15

## 2014-09-03 MED ORDER — FLUOXETINE HCL 10 MG PO TABS: 10.0000 mg | ORAL_TABLET | Freq: Every day | ORAL | Status: DC

## 2014-09-03 NOTE — Progress Notes (Signed)
Pre visit review using our clinic review tool, if applicable. No additional management support is needed unless otherwise documented below in the visit note. 

## 2014-09-03 NOTE — Progress Notes (Signed)
Marisa Fletcher  [id number] [birth year] 09/03/2014      Progress Note-Follow Up  Subjective  Chief Complaint  Chief Complaint  Patient presents with  . Anxiety    HPI  Patient is a 29 y.o. female in today for routine medical care. She is in today to discuss meds and symptoms. Anxiety is improving nicely on Fluoxetine. She feels this works better for her. No suicidal ideation and anhedonia is improving. No recent illness but she did have 2 episodes of her throat swelling when she ate MMs and eggs. No SOB or CP. Denies CP/palp/SOB/HA/congestion/fevers/GI or GU c/o. Taking meds as prescribed  Past Medical History  Diagnosis Date  . Allergy   . Asthma   . Depression   . Depression with anxiety 04/17/2010    Qualifier: Diagnosis of  By: Abner Greenspan MD, Misty Stanley    . Preventative health care 07/09/2012  . 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 Cholecystectomy  2007    with pancreatitis  . Wisdom teeth extracted      Family History  Problem Relation Age of Onset  . Cholelithiasis Father   . Allergies Father   . Asthma Father   . Anxiety disorder Sister   . ADD / ADHD Brother     ADD  . Memory loss Maternal Grandmother   . Depression Maternal Grandmother     possible bipolar  . Obesity Maternal Grandmother   . Alcohol abuse Maternal Grandfather   . Cancer Paternal Grandmother   . Heart disease Paternal Grandmother   . Asthma Paternal Grandfather   . Allergies Paternal Grandfather   . Cholelithiasis Paternal Grandfather   . ADD / ADHD Sister     ADD  . Other Mother     Back disease    History   Social History  . Marital Status: Married    Spouse Name: N/A  . Number of Children: N/A  . Years of Education: N/A   Occupational History  . Not on file.   Social History Main Topics  . Smoking status: Former Smoker -- 0.10 packs/day    Quit date: 12/28/2009  . Smokeless tobacco: Not on file  . Alcohol Use:  0.0 oz/week    0 drink(s) per week  . Drug Use: No  . Sexual Activity: Not on file   Other Topics Concern  . Not on file   Social History Narrative    Current Outpatient Prescriptions on File Prior to Visit  Medication Sig Dispense Refill  . albuterol (PROVENTIL HFA) 108 (90 BASE) MCG/ACT inhaler Inhale 2 puffs into the lungs every 6 (six) hours as needed. 1-2 puffs q 4-6 hours prn for exercise/cough/wheeze/ SOB 18 g 3  . clonazePAM (KLONOPIN) 0.5 MG tablet Take 1 tablet (0.5 mg total) by mouth as needed. For anxiety 30 tablet 3  . FLUoxetine (PROZAC) 10 MG tablet Take 1 tablet (10 mg total) by mouth daily. 30 tablet 3  . Probiotic Product (PROBIOTIC DAILY PO) Take by mouth.    . ranitidine (ZANTAC) 300 MG capsule Take 1 capsule (300 mg total) by mouth every evening. 30 capsule 2  . loratadine (CLARITIN) 10 MG tablet Take 1 tablet (10 mg total) by mouth daily. (Patient not taking: Reported on 09/03/2014) 30 tablet 5   No current facility-administered medications on file prior to visit.    Allergies  Allergen Reactions  . Citalopram Nausea Only    insomnia  Review of Systems  Review of Systems  Constitutional: Negative for fever and malaise/fatigue.  HENT: Negative for congestion.   Eyes: Negative for discharge.  Respiratory: Negative for shortness of breath.   Cardiovascular: Negative for chest pain, palpitations and leg swelling.  Gastrointestinal: Positive for nausea and vomiting. Negative for abdominal pain and diarrhea.  Genitourinary: Negative for dysuria.  Musculoskeletal: Negative for falls.  Skin: Negative for rash.  Neurological: Negative for loss of consciousness and headaches.  Endo/Heme/Allergies: Negative for polydipsia.  Psychiatric/Behavioral: Negative for depression and suicidal ideas. The patient is nervous/anxious. The patient does not have insomnia.     Objective  BP 122/84 mmHg  Pulse 68  Temp(Src) 98.7 F (37.1 C) (Oral)  Ht  (1.626 m)   Wt 194 lb 2 oz (88.055 kg)  BMI 33.31 kg/m2  SpO2 99%  LMP 08/29/2014  Physical Exam  Physical Exam  Constitutional: She is oriented to person, place, and time and well-developed, well-nourished, and in no distress. No distress.  HENT:  Head: Normocephalic and atraumatic.  Right Ear: External ear normal.  Left Ear: External ear normal.  Nose: Nose normal.  Mouth/Throat: Oropharynx is clear and moist. No oropharyngeal exudate.  Eyes: Conjunctivae are normal. Pupils are equal, round, and reactive to light. Right eye exhibits no discharge. Left eye exhibits no discharge. No scleral icterus.  Neck: Normal range of motion. Neck supple. No thyromegaly present.  Cardiovascular: Normal rate, regular rhythm, normal heart sounds and intact distal pulses.   No murmur heard. Pulmonary/Chest: Effort normal and breath sounds normal. No respiratory distress. She has no wheezes. She has no rales.  Abdominal: Soft. Bowel sounds are normal. She exhibits no distension and no mass. There is no tenderness.  Genitourinary: No vaginal discharge found.  Musculoskeletal: Normal range of motion. She exhibits no edema or tenderness.  Lymphadenopathy:    She has no cervical adenopathy.  Neurological: She is alert and oriented to person, place, and time. She has normal reflexes. No cranial nerve deficit. Coordination normal.  Skin: Skin is warm and dry. No rash noted. She is not diaphoretic.  Psychiatric: Mood, memory and affect normal.    Lab Results  Component Value Date   TSH 0.67 04/09/2014   Lab Results  Component Value Date   WBC 5.9 04/09/2014   HGB 13.4 04/09/2014   HCT 41.4 04/09/2014   MCV 87.0 04/09/2014   PLT 304.0 04/09/2014   Lab Results  Component Value Date   CREATININE 0.8 04/09/2014   BUN 10 04/09/2014   NA 137 04/09/2014   K 4.1 04/09/2014   CL 105 04/09/2014   CO2 25 04/09/2014   Lab Results  Component Value Date   ALT 19 04/09/2014   AST 21 04/09/2014   ALKPHOS 61  04/09/2014   BILITOT 0.4 04/09/2014   Lab Results  Component Value Date   CHOL 127 07/04/2012   Lab Results  Component Value Date   HDL 51 07/04/2012   Lab Results  Component Value Date   LDLCALC 65 07/04/2012   Lab Results  Component Value Date   TRIG 55 07/04/2012   Lab Results  Component Value Date   CHOLHDL 2.5 07/04/2012     Assessment & Plan  Asthma No recent flares doing well  Depression with anxiety Doing well with Fluoxetine, she reports a good improvement. No recent concerns.continue current meds  GERD Avoid offending foods, start probiotics. Do not eat large meals in late evening and consider raising head of bed.  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 She had recent episodes of her throat swelling when she ate eggs and mm's will avoid these foods and given an Epipen to use as directed

## 2014-09-03 NOTE — Patient Instructions (Signed)
Anaphylactic Reaction An anaphylactic reaction is a sudden, severe allergic reaction that involves the whole body. It can be life threatening. A hospital stay is often required. People with asthma, eczema, or hay fever are slightly more likely to have an anaphylactic reaction. CAUSES  An anaphylactic reaction may be caused by anything to which you are allergic. After being exposed to the allergic substance, your immune system becomes sensitized to it. When you are exposed to that allergic substance again, an allergic reaction can occur. Common causes of an anaphylactic reaction include:  Medicines.  Foods, especially peanuts, wheat, shellfish, milk, and eggs.  Insect bites or stings.  Blood products.  Chemicals, such as dyes, latex, and contrast material used for imaging tests. SYMPTOMS  When an allergic reaction occurs, the body releases histamine and other substances. These substances cause symptoms such as tightening of the airway. Symptoms often develop within seconds or minutes of exposure. Symptoms may include:  Skin rash or hives.  Itching.  Chest tightness.  Swelling of the eyes, tongue, or lips.  Trouble breathing or swallowing.  Lightheadedness or fainting.  Anxiety or confusion.  Stomach pains, vomiting, or diarrhea.  Nasal congestion.  A fast or irregular heartbeat (palpitations). DIAGNOSIS  Diagnosis is based on your history of recent exposure to allergic substances, your symptoms, and a physical exam. Your caregiver may also perform blood or urine tests to confirm the diagnosis. TREATMENT  Epinephrine medicine is the main treatment for an anaphylactic reaction. Other medicines that may be used for treatment include antihistamines, steroids, and albuterol. In severe cases, fluids and medicine to support blood pressure may be given through an intravenous line (IV). Even if you improve after treatment, you need to be observed to make sure your condition does not get  worse. This may require a stay in the hospital. HOME CARE INSTRUCTIONS   Wear a medical alert bracelet or necklace stating your allergy.  You and your family must learn how to use an anaphylaxis kit or give an epinephrine injection to temporarily treat an emergency allergic reaction. Always carry your epinephrine injection or anaphylaxis kit with you. This can be lifesaving if you have a severe reaction.  Do not drive or perform tasks after treatment until the medicines used to treat your reaction have worn off, or until your caregiver says it is okay.  If you have hives or a rash:  Take medicines as directed by your caregiver.  You may use an over-the-counter antihistamine (diphenhydramine) as needed.  Apply cold compresses to the skin or take baths in cool water. Avoid hot baths or showers. SEEK MEDICAL CARE IF:   You develop symptoms of an allergic reaction to a new substance. Symptoms may start right away or minutes later.  You develop a rash, hives, or itching.  You develop new symptoms. SEEK IMMEDIATE MEDICAL CARE IF:   You have swelling of the mouth, difficulty breathing, or wheezing.  You have a tight feeling in your chest or throat.  You develop hives, swelling, or itching all over your body.  You develop severe vomiting or diarrhea.  You feel faint or pass out. This is an emergency. Use your epinephrine injection or anaphylaxis kit as you have been instructed. Call your local emergency services (911 in [place] Even if you improve after the injection, you need to be examined at a hospital emergency department. MAKE SURE YOU:   Understand these instructions.  Will watch your condition.  Will get help right away if you are not   doing well or get worse. Document Released: 03/16/2005 Document Revised: 03/21/2013 Document Reviewed: 06/17/2011 [facility] Patient Information 2015 [place], [place]. This information is not intended to replace advice given to you by your health  care provider. Make sure you discuss any questions you have with your health care provider. Epinephrine Injection Epinephrine is a medicine given by injection to temporarily treat an emergency allergic reaction. It is also used to treat severe asthmatic attacks and other lung problems. The medicine helps to enlarge (dilate) the small breathing tubes of the lungs. A life-threatening, sudden allergic reaction that involves the whole body is called anaphylaxis. Because of potential side effects, epinephrine should only be used as directed by your caregiver. RISKS AND COMPLICATIONS Possible side effects of epinephrine injections include:  Chest pain.  Irregular or rapid heartbeat.  Shortness of breath.  Nausea.  Vomiting.  Abdominal pain or cramping.  Sweating.  Dizziness.  Weakness.  Headache.  Nervousness. Report all side effects to your caregiver. HOW TO GIVE AN EPINEPHRINE INJECTION Give the epinephrine injection immediately when symptoms of a severe reaction begin. Inject the medicine into the outer thigh or any available, large muscle. Your caregiver can teach you how to do this. You do not need to remove any clothing. After the injection, call your local emergency services (911 in [place] Even if you improve after the injection, you need to be examined at a hospital emergency department. Epinephrine works quickly, but it also wears off quickly. Delayed reactions can occur. A delayed reaction may be as serious and dangerous as the initial reaction. HOME CARE INSTRUCTIONS  Make sure you and your family know how to give an epinephrine injection.  Use epinephrine injections as directed by your caregiver. Do not use this medicine more often or in larger doses than prescribed.  Always carry your epinephrine injection or anaphylaxis kit with you. This can be lifesaving if you have a severe reaction.  Store the medicine in a cool, dry place. If the medicine becomes discolored or  cloudy, dispose of it properly and replace it with new medicine.  Check the expiration date on your medicine. It may be unsafe to use medicines past their expiration date.  Tell your caregiver about any other medicines you are taking. Some medicines can react badly with epinephrine.  Tell your caregiver about any medical conditions you have, such as diabetes, high blood pressure (hypertension), heart disease, irregular heartbeats, or if you are pregnant. SEEK IMMEDIATE MEDICAL CARE IF:  You have used an epinephrine injection. Call your local emergency services (911 in [place] Even if you improve after the injection, you need to be examined at a hospital emergency department to make sure your allergic reaction is under control. You will also be monitored for adverse effects from the medicine.  You have chest pain.  You have irregular or fast heartbeats.  You have shortness of breath.  You have severe headaches.  You have severe nausea, vomiting, or abdominal cramps.  You have severe pain, swelling, or redness in the area where you gave the injection. Document Released: 03/13/2000 Document Revised: 06/08/2011 Document Reviewed: 12/03/2010 [facility] Patient Information 2015 [place], [place]. This information is not intended to replace advice given to you by your health care provider. Make sure you discuss any questions you have with your health care provider.

## 2014-09-05 ENCOUNTER — Encounter: Payer: Self-pay | Admitting: Family Medicine

## 2014-09-05 NOTE — Assessment & Plan Note (Signed)
Doing well with Fluoxetine, she reports a good improvement. No recent concerns.continue current meds

## 2014-09-05 NOTE — Assessment & Plan Note (Signed)
No recent flares doing well 

## 2014-09-09 NOTE — Assessment & Plan Note (Signed)
Avoid offending foods, start probiotics. Do not eat large meals in late evening and consider raising head of bed.  

## 2014-09-09 NOTE — Assessment & Plan Note (Signed)
GI symptoms better when she avoids foods she tested positive for. She had recent episodes of her throat swelling when she ate eggs and mm's will avoid these foods and given an Epipen to use as directed

## 2014-10-10 ENCOUNTER — Other Ambulatory Visit: Payer: Self-pay | Admitting: Family Medicine

## 2014-10-10 DIAGNOSIS — F419 Anxiety disorder, unspecified: Principal | ICD-10-CM

## 2014-10-10 DIAGNOSIS — F329 Major depressive disorder, single episode, unspecified: Secondary | ICD-10-CM

## 2014-10-10 DIAGNOSIS — F418 Other specified anxiety disorders: Secondary | ICD-10-CM

## 2014-10-10 NOTE — Telephone Encounter (Signed)
Caller name: Kijana Relation to pt: self Call back number:  [phone] [facility] [facility]  Reason for call:   Requesting refills of klonopin and fluoxetine

## 2014-10-11 MED ORDER — FLUOXETINE HCL 10 MG PO TABS: 10.0000 mg | ORAL_TABLET | Freq: Every day | ORAL | Status: DC

## 2014-10-11 MED ORDER — CLONAZEPAM 0.5 MG PO TABS
0.5000 mg | ORAL_TABLET | ORAL | Status: DC | PRN
Start: 2014-10-11 — End: 2017-09-02

## 2014-10-11 NOTE — Telephone Encounter (Signed)
[facility]  Patient informed refill done.

## 2014-11-27 ENCOUNTER — Telehealth: Payer: Self-pay | Admitting: Family Medicine

## 2014-11-27 DIAGNOSIS — F418 Other specified anxiety disorders: Secondary | ICD-10-CM

## 2014-11-27 MED ORDER — FLUOXETINE HCL 10 MG PO TABS: 10.0000 mg | ORAL_TABLET | Freq: Every day | ORAL | Status: DC

## 2014-11-27 NOTE — Telephone Encounter (Signed)
Refill done.  

## 2014-11-27 NOTE — Telephone Encounter (Signed)
Pt called stating [facility] in [place] is needing approval from Dr. Abner Greenspan to refill her Prozac. Call [facility] at [phone].

## 2014-12-31 ENCOUNTER — Ambulatory Visit: Payer: BLUE CROSS/BLUE SHIELD | Admitting: Family Medicine

## 2015-03-01 ENCOUNTER — Encounter (HOSPITAL_BASED_OUTPATIENT_CLINIC_OR_DEPARTMENT_OTHER): Payer: Self-pay | Admitting: *Deleted

## 2015-03-01 ENCOUNTER — Emergency Department (HOSPITAL_BASED_OUTPATIENT_CLINIC_OR_DEPARTMENT_OTHER)
Admission: EM | Admit: 2015-03-01 | Discharge: 2015-03-01 | Disposition: A | Discharge status: Home / Self Care | Payer: BLUE CROSS/BLUE SHIELD | Attending: Emergency Medicine | Admitting: Emergency Medicine

## 2015-03-01 DIAGNOSIS — F418 Other specified anxiety disorders: Secondary | ICD-10-CM | POA: Diagnosis not present

## 2015-03-01 DIAGNOSIS — O99511 Diseases of the respiratory system complicating pregnancy, first trimester: Secondary | ICD-10-CM | POA: Insufficient documentation

## 2015-03-01 DIAGNOSIS — O99341 Other mental disorders complicating pregnancy, first trimester: Secondary | ICD-10-CM | POA: Insufficient documentation

## 2015-03-01 DIAGNOSIS — Z79899 Other long term (current) drug therapy: Secondary | ICD-10-CM | POA: Insufficient documentation

## 2015-03-01 DIAGNOSIS — Z3A01 Less than 8 weeks gestation of pregnancy: Secondary | ICD-10-CM | POA: Insufficient documentation

## 2015-03-01 DIAGNOSIS — R81 Glycosuria: Secondary | ICD-10-CM | POA: Insufficient documentation

## 2015-03-01 DIAGNOSIS — O209 Hemorrhage in early pregnancy, unspecified: Secondary | ICD-10-CM | POA: Diagnosis present

## 2015-03-01 DIAGNOSIS — O9989 Other specified diseases and conditions complicating pregnancy, childbirth and the puerperium: Secondary | ICD-10-CM | POA: Insufficient documentation

## 2015-03-01 DIAGNOSIS — O039 Complete or unspecified spontaneous abortion without complication: Secondary | ICD-10-CM | POA: Insufficient documentation

## 2015-03-01 DIAGNOSIS — J45909 Unspecified asthma, uncomplicated: Secondary | ICD-10-CM | POA: Diagnosis not present

## 2015-03-01 DIAGNOSIS — Z87891 Personal history of nicotine dependence: Secondary | ICD-10-CM | POA: Diagnosis not present

## 2015-03-01 LAB — URINALYSIS, ROUTINE W REFLEX MICROSCOPIC
KETONES UR: 15 mg/dL — AB
Nitrite: NEGATIVE
PH: 5.5 (ref 5.0–8.0)
Protein, ur: 100 mg/dL — AB
SPECIFIC GRAVITY, URINE: 1.04 — AB (ref 1.005–1.030)

## 2015-03-01 LAB — URINE MICROSCOPIC-ADD ON

## 2015-03-01 LAB — PREGNANCY, URINE: Preg Test, Ur: NEGATIVE

## 2015-03-01 MED ORDER — OXYCODONE-ACETAMINOPHEN 5-325 MG PO TABS
1.0000 | ORAL_TABLET | Freq: Once | ORAL | Status: AC
  Administered 2015-03-01: 1 via ORAL
  Filled 2015-03-01: qty 1

## 2015-03-01 NOTE — ED Notes (Signed)
Lower abdominal cramps. Vaginal bleeding since this am. She is [redacted] weeks pregnant.

## 2015-03-01 NOTE — ED Provider Notes (Signed)
CSN: [id number]     Arrival date & time 03/01/15  1554 History   First MD Initiated Contact with Patient 03/01/15 1643     Chief Complaint  Patient presents with  . Vaginal Bleeding  . [redacted] weeks pregnant      (Consider location/radiation/quality/duration/timing/severity/associated sxs/prior Treatment) HPI   Patient comes to the ED for vaginal bleeding. Based on her last menstrual cycle believes to be 5 weeks. She took a pregnancy test approx 1 week ago due to morning sickness and it was positive. Today she developed vaginal bleeding that has increased. She has had cramping, a large amount of tissue passage and bleeding. She has not had any fevers, nausea, vomiting or diarrhea. She denies severe pain.  Past Medical History  Diagnosis Date  . Allergy   . Asthma   . Depression   . Depression with anxiety 04/17/2010    Qualifier: Diagnosis of  By: Abner Greenspan MD, Misty Stanley    . Preventative health care 07/09/2012  . 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 Cholecystectomy  2007    with pancreatitis  . Wisdom teeth extracted     Family History  Problem Relation Age of Onset  . Cholelithiasis Father   . Allergies Father   . Asthma Father   . Anxiety disorder Sister   . ADD / ADHD Brother     ADD  . Memory loss Maternal Grandmother   . Depression Maternal Grandmother     possible bipolar  . Obesity Maternal Grandmother   . Alcohol abuse Maternal Grandfather   . Cancer Paternal Grandmother   . Heart disease Paternal Grandmother   . Asthma Paternal Grandfather   . Allergies Paternal Grandfather   . Cholelithiasis Paternal Grandfather   . ADD / ADHD Sister     ADD  . Other Mother     Back disease   Social History  Substance Use Topics  . Smoking status: Former Smoker -- 0.10 packs/day    Quit date: 12/28/2009  . Smokeless tobacco: None  . Alcohol Use: 0.0 oz/week    0 drink(s) per week   OB History    Gravida Para  Term Preterm AB TAB SAB Ectopic Multiple Living   1              Review of Systems  All other systems reviewed and are negative.     Allergies  Eggs or egg-derived products; Peanut-containing drug products; and Citalopram  Home Medications   Prior to Admission medications   Medication Sig Start Date End Date Taking? Authorizing Provider  albuterol (PROVENTIL HFA) 108 (90 BASE) MCG/ACT inhaler Inhale 2 puffs into the lungs every 6 (six) hours as needed. 1-2 puffs q 4-6 hours prn for exercise/cough/wheeze/ SOB 07/04/12   Bradd Canary, MD  clonazePAM (KLONOPIN) 0.5 MG tablet Take 1 tablet (0.5 mg total) by mouth as needed. For anxiety 10/11/14   Bradd Canary, MD  EPINEPHrine 0.3 mg/0.3 mL IJ SOAJ injection Inject 0.3 mLs (0.3 mg total) into the muscle once. 09/03/14   Bradd Canary, MD  FLUoxetine (PROZAC) 10 MG tablet Take 1 tablet (10 mg total) by mouth daily. 11/27/14   Bradd Canary, MD  loratadine (CLARITIN) 10 MG tablet Take 1 tablet (10 mg total) by mouth daily. Patient not taking: Reported on 09/03/2014 07/04/12   Bradd Canary, MD  Probiotic Product (PROBIOTIC DAILY PO) Take by mouth.  Historical Provider, MD  ranitidine (ZANTAC) 300 MG capsule Take 1 capsule (300 mg total) by mouth every evening. 02/27/14   Bradd CanaryStacey A Blyth, MD   BP 115/75 mmHg  Pulse 60  Temp(Src) 98.3 F (36.8 C) (Oral)  Resp 20  Ht 5\' 4"  (1.626 m)  Wt 83.462 kg  BMI 31.57 kg/m2  SpO2 100%  LMP 01/23/2015 Physical Exam  Constitutional: She appears well-developed and well-nourished. No distress.  HENT:  Head: Normocephalic and atraumatic.  Eyes: Pupils are equal, round, and reactive to light.  Neck: Normal range of motion. Neck supple.  Cardiovascular: Normal rate and regular rhythm.   Pulmonary/Chest: Effort normal.  Abdominal: Soft.  Genitourinary: Uterus is enlarged. Cervix exhibits no motion tenderness, no discharge and no friability. Right adnexum displays no mass and no tenderness. Left adnexum  displays no mass and no tenderness. There is bleeding in the vagina.  Cervix is closed   Neurological: She is alert.  Skin: Skin is warm and dry.  Nursing note and vitals reviewed.   ED Course  Procedures (including critical care time) Labs Review Labs Reviewed  URINALYSIS, ROUTINE W REFLEX MICROSCOPIC (NOT AT [facility]) - Abnormal; Notable for the following:    Color, Urine RED (*)    APPearance TURBID (*)    Specific Gravity, Urine 1.040 (*)    Glucose, UA >1000 (*)    Hgb urine dipstick LARGE (*)    Bilirubin Urine SMALL (*)    Ketones, ur 15 (*)    Protein, ur 100 (*)    Leukocytes, UA SMALL (*)    All other components within normal limits  URINE MICROSCOPIC-ADD ON - Abnormal; Notable for the following:    Squamous Epithelial / LPF 0-5 (*)    Bacteria, UA RARE (*)    All other components within normal limits  PREGNANCY, URINE    Imaging Review No results found. I have personally reviewed and evaluated these images and lab results as part of my medical decision-making.   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 Cervix is closed, she had a miscarriage, referred [facility] follow-up.  Medications  oxyCODONE-acetaminophen (PERCOCET/ROXICET) 5-325 MG per tablet 1 tablet (1 tablet Oral Given 03/01/15 1758)     I feel the patient has had an appropriate workup for their chief complaint at this time and likelihood of emergent condition existing is low. Discussed s/sx that warrant return to the ED.  Filed Vitals:   03/01/15 1607 03/01/15 1759  BP: 134/84 115/75  Pulse: 75 60  Temp: 98.3 F (36.8 C)   Resp: 20 [address]20       Katieann Hungate, PA-C 03/01/15 1834  Melene Planan Floyd, DO 03/01/15 2126

## 2015-03-01 NOTE — Discharge Instructions (Signed)
Miscarriage  A miscarriage is the sudden loss of an unborn baby (fetus) before the 20th week of pregnancy. Most miscarriages happen in the first 3 months of pregnancy. Sometimes, it happens before a woman even knows she is pregnant. A miscarriage is also called a "spontaneous miscarriage" or "early pregnancy loss." Having a miscarriage can be an emotional experience. Talk with your caregiver about any questions you may have about miscarrying, the grieving process, and your future pregnancy plans.  CAUSES    Problems with the fetal chromosomes that make it impossible for the baby to develop normally. Problems with the baby's genes or chromosomes are most often the result of errors that occur, by chance, as the embryo divides and grows. The problems are not inherited from the parents.   Infection of the cervix or uterus.    Hormone problems.    Problems with the cervix, such as having an incompetent cervix. This is when the tissue in the cervix is not strong enough to hold the pregnancy.    Problems with the uterus, such as an abnormally shaped uterus, uterine fibroids, or congenital abnormalities.    Certain medical conditions.    Smoking, drinking alcohol, or taking illegal drugs.    Trauma.   Often, the cause of a miscarriage is unknown.   SYMPTOMS    Vaginal bleeding or spotting, with or without cramps or pain.   Pain or cramping in the abdomen or lower back.   Passing fluid, tissue, or blood clots from the vagina.  DIAGNOSIS   Your caregiver will perform a physical exam. You may also have an ultrasound to confirm the miscarriage. Blood or urine tests may also be ordered.  TREATMENT    Sometimes, treatment is not necessary if you naturally pass all the fetal tissue that was in the uterus. If some of the fetus or placenta remains in the body (incomplete miscarriage), tissue left behind may become infected and must be removed. Usually, a dilation and curettage (D and C) procedure is performed.  During a D and C procedure, the cervix is widened (dilated) and any remaining fetal or placental tissue is gently removed from the uterus.   Antibiotic medicines are prescribed if there is an infection. Other medicines may be given to reduce the size of the uterus (contract) if there is a lot of bleeding.   If you have Rh negative blood and your baby was Rh positive, you will need a Rh immunoglobulin shot. This shot will protect any future baby from having Rh blood problems in future pregnancies.  HOME CARE INSTRUCTIONS    Your caregiver may order bed rest or may allow you to continue light activity. Resume activity as directed by your caregiver.   Have someone help with home and family responsibilities during this time.    Keep track of the number of sanitary pads you use each day and how soaked (saturated) they are. Write down this information.    Do not use tampons. Do not douche or have sexual intercourse until approved by your caregiver.    Only take over-the-counter or prescription medicines for pain or discomfort as directed by your caregiver.    Do not take aspirin. Aspirin can cause bleeding.    Keep all follow-up appointments with your caregiver.    If you or your partner have problems with grieving, talk to your caregiver or seek counseling to help cope with the pregnancy loss. Allow enough time to grieve before trying to get pregnant again.     SEEK IMMEDIATE MEDICAL CARE IF:    You have severe cramps or pain in your back or abdomen.   You have a fever.   You pass large blood clots (walnut-sized or larger) ortissue from your vagina. Save any tissue for your caregiver to inspect.    Your bleeding increases.    You have a thick, bad-smelling vaginal discharge.   You become lightheaded, weak, or you faint.    You have chills.   MAKE SURE YOU:   Understand these instructions.   Will watch your condition.   Will get help right away if you are not doing well or get worse.     This  information is not intended to replace advice given to you by your health care provider. Make sure you discuss any questions you have with your health care provider.     Document Released: 09/09/2000 Document Revised: 07/11/2012 Document Reviewed: 05/05/2011  Elsevier Interactive Patient Education 2016 [employer]

## 2017-09-02 ENCOUNTER — Ambulatory Visit (INDEPENDENT_AMBULATORY_CARE_PROVIDER_SITE_OTHER): Payer: BLUE CROSS/BLUE SHIELD | Admitting: Family Medicine

## 2017-09-02 ENCOUNTER — Encounter: Payer: Self-pay | Admitting: Family Medicine

## 2017-09-02 VITALS — BP 120/76 | HR 73 | Temp 98.3°F | Resp 18 | Ht 64.0 in | Wt 191.6 lb

## 2017-09-02 DIAGNOSIS — F418 Other specified anxiety disorders: Secondary | ICD-10-CM

## 2017-09-02 DIAGNOSIS — Z91018 Allergy to other foods: Secondary | ICD-10-CM

## 2017-09-02 DIAGNOSIS — J45909 Unspecified asthma, uncomplicated: Secondary | ICD-10-CM

## 2017-09-02 DIAGNOSIS — R1013 Epigastric pain: Secondary | ICD-10-CM

## 2017-09-02 DIAGNOSIS — J309 Allergic rhinitis, unspecified: Secondary | ICD-10-CM

## 2017-09-02 DIAGNOSIS — F32A Depression, unspecified: Secondary | ICD-10-CM

## 2017-09-02 DIAGNOSIS — E663 Overweight: Secondary | ICD-10-CM

## 2017-09-02 DIAGNOSIS — F329 Major depressive disorder, single episode, unspecified: Secondary | ICD-10-CM

## 2017-09-02 DIAGNOSIS — K219 Gastro-esophageal reflux disease without esophagitis: Secondary | ICD-10-CM

## 2017-09-02 DIAGNOSIS — Z0000 Encounter for general adult medical examination without abnormal findings: Secondary | ICD-10-CM

## 2017-09-02 DIAGNOSIS — Z124 Encounter for screening for malignant neoplasm of cervix: Secondary | ICD-10-CM

## 2017-09-02 DIAGNOSIS — Z91014 Allergy to mammalian meats: Secondary | ICD-10-CM

## 2017-09-02 DIAGNOSIS — F419 Anxiety disorder, unspecified: Secondary | ICD-10-CM

## 2017-09-02 MED ORDER — MONTELUKAST SODIUM 10 MG PO TABS: 10.0000 mg | ORAL_TABLET | Freq: Every evening | ORAL | 3 refills | Status: DC | PRN

## 2017-09-02 MED ORDER — CLONAZEPAM 0.5 MG PO TABS: 0.5000 mg | ORAL_TABLET | ORAL | 3 refills | Status: DC | PRN

## 2017-09-02 NOTE — Assessment & Plan Note (Signed)
Encouraged DASH diet, decrease po intake and increase exercise as tolerated. Needs 7-8 hours of sleep nightly. Avoid trans fats, eat small, frequent meals every 4-5 hours with lean proteins, complex carbs and healthy fats. Minimize simple carbs 

## 2017-09-02 NOTE — Patient Instructions (Addendum)
Consider taking Miralax and benefiber mixed together once or twice a day Allergies, Adult An allergy is when your body's defense system (immune system) overreacts to an otherwise harmless substance (allergen) that you breathe in or eat or something that touches your skin. When you come into contact with something that you are allergic to, your immune system produces certain proteins (antibodies). These proteins cause cells to release chemicals (histamines) that trigger the symptoms of an allergic reaction. Allergies often affect the nasal passages (allergic rhinitis), eyes (allergic conjunctivitis), skin (atopic dermatitis), and stomach. Allergies can be mild or severe. Allergies cannot spread from person to person (are not contagious). They can develop at any age and may be outgrown. What increases the risk? You may be at greater risk of allergies if other people in your family have allergies. What are the signs or symptoms? Symptoms depend on what type of allergy you have. They may include:  Runny, stuffy nose.  Sneezing.  Itchy mouth, ears, or throat.  Postnasal drip.  Sore throat.  Itchy, red, watery, or puffy eyes.  Skin rash or hives.  Stomach pain.  Vomiting.  Diarrhea.  Bloating.  Wheezing or coughing.  People with a severe allergy to food, medicine, or an insect bite may have a life-threatening allergic reaction (anaphylaxis). Symptoms of anaphylaxis include:  Hives.  Itching.  Flushed face.  Swollen lips, tongue, or mouth.  Tight or swollen throat.  Chest pain or tightness in the chest.  Trouble breathing or shortness of breath.  Rapid heartbeat.  Dizziness or fainting.  Vomiting.  Diarrhea.  Pain in the abdomen.  How is this diagnosed? This condition is diagnosed based on:  Your symptoms.  Your family and medical history.  A physical exam.  You may need to see a health care provider who specializes in treating allergies (allergist). You  may also have tests, including:  Skin tests to see which allergens are causing your symptoms, such as: ? Skin prick test. In this test, your skin is pricked with a tiny needle and exposed to small amounts of possible allergens to see if your skin reacts. ? Intradermal skin test. In this test, a small amount of allergen is injected under your skin to see if your skin reacts. ? Patch test. In this test, a small amount of allergen is placed on your skin and then your skin is covered with a bandage. Your health care provider will check your skin after a couple of days to see if a rash has developed.  Blood tests.  Challenges tests. In this test, you inhale a small amount of allergen by mouth to see if you have an allergic reaction.  You may also be asked to:  Keep a food diary. A food diary is a record of all the foods and drinks you have in a day and any symptoms you experience.  Practice an elimination diet. An elimination diet involves eliminating specific foods from your diet and then adding them back in one by one to find out if a certain food causes an allergic reaction.  How is this treated? Treatment for allergies depends on your symptoms. Treatment may include:  Cold compresses to soothe itching and swelling.  Eye drops.  Nasal sprays.  Using a saline spray or container (neti pot) to flush out the nose (nasal irrigation). These methods can help clear away mucus and keep the nasal passages moist.  Using a humidifier.  Oral antihistamines or other medicines to block allergic reaction and inflammation.  Skin creams to treat rashes or itching.  Diet changes to eliminate food allergy triggers.  Repeated exposure to tiny amounts of allergens to build up a tolerance and prevent future allergic reactions (immunotherapy). These include: ? Allergy shots. ? Oral treatment. This involves taking small doses of an allergen under the tongue (sublingual immunotherapy).  Emergency  epinephrine injection (auto-injector) in case of an allergic emergency. This is a self-injectable, pre-measured medicine that must be given within the first few minutes of a serious allergic reaction.  Follow these instructions at home:  Avoid known allergens whenever possible.  If you suffer from airborne allergens, wash out your nose daily. You can do this with a saline spray or a neti pot to flush out your nose (nasal irrigation).  Take over-the-counter and prescription medicines only as told by your health care provider.  Keep all follow-up visits as told by your health care provider. This is important.  If you are at risk of a severe allergic reaction (anaphylaxis), keep your auto-injector with you at all times.  If you have ever had anaphylaxis, wear a medical alert bracelet or necklace that states you have a severe allergy. Contact a health care provider if:  Your symptoms do not improve with treatment. Get help right away if:  You have symptoms of anaphylaxis, such as: ? Swollen mouth, tongue, or throat. ? Pain or tightness in your chest. ? Trouble breathing or shortness of breath. ? Dizziness or fainting. ? Severe abdominal pain, vomiting, or diarrhea. This information is not intended to replace advice given to you by your health care provider. Make sure you discuss any questions you have with your health care provider. Document Released: 06/09/2002 Document Revised: 07/15/2016 Document Reviewed: 10/02/2015 Elsevier Interactive Patient Education  [employer]2018 [employer]

## 2017-09-02 NOTE — Assessment & Plan Note (Signed)
Avoid offending foods, start probiotics. Do not eat large meals in late evening and consider raising head of bed. Had recent episode of epigastric pain. Use Ranitidine qhs

## 2017-09-02 NOTE — Progress Notes (Signed)
Subjective:  I acted as a Neurosurgeon for Dr. Abner Greenspan. [place], [place]  Patient ID: Marisa Fletcher, female    DOB: 08/18/1985, 32 y.o.   MRN: [id number]  Chief Complaint  Patient presents with  . Medication Refill    HPI  Patient is in today for medication follow up and to reestablish care. She has been without health insurance until recently so had not been in for follow up. She feels well today but continues to deal with high levels of stress due to buying a house, getting married, work etc. No recent febrile illness or hospitalizations. She has had trouble with congestion and her allergies. She has had some trouble with her breathing and needs a refill on her albuterol. Denies CP/palp/SOB/HA/congestion/fevers/GI or GU c/o. Taking meds as prescribed. She notes some intermittent reflux and epigastric discomfort. .   Patient Care Team: Bradd Canary, MD as PCP - General (Family Medicine)   Past Medical History:  Diagnosis Date  . Allergy   . 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 Asthma   . Depression   . Depression with anxiety 04/17/2010   Qualifier: Diagnosis of  By: Abner Greenspan MD, Misty Stanley    . 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 Preventative health care 07/09/2012    Past Surgical History:  Procedure Laterality Date  . CHOLECYSTECTOMY  2007   with pancreatitis  . wisdom teeth extracted      Family History  Problem Relation Age of Onset  . Cholelithiasis Father   . Allergies Father   . Asthma Father   . Anxiety disorder Sister   . ADD / ADHD Brother        ADD  . Memory loss Maternal Grandmother   . Depression Maternal Grandmother        possible bipolar  . Obesity Maternal Grandmother   . Alcohol abuse Maternal Grandfather   . Cancer Paternal Grandmother   . Heart disease Paternal Grandmother   . Asthma Paternal Grandfather   . Allergies Paternal Grandfather   . Cholelithiasis Paternal Grandfather   . ADD / ADHD Sister        ADD  . Other Mother        Back disease    Social History    Socioeconomic History  . Marital status: Married    Spouse name: Not on file  . Number of children: Not on file  . Years of education: Not on file  . Highest education level: Not on file  Occupational History  . Not on file  Social Needs  . Financial resource strain: Not on file  . Food insecurity:    Worry: Not on file    Inability: Not on file  . Transportation needs:    Medical: Not on file    Non-medical: Not on file  Tobacco Use  . Smoking status: Former Smoker    Packs/day: 0.10    Last attempt to quit: 12/28/2009    Years since quitting: 7.6  . Smokeless tobacco: Never Used  Substance and Sexual Activity  . Alcohol use: Yes    Alcohol/week: 0.0 oz  . Drug use: No  . Sexual activity: Yes    Birth control/protection: None  Lifestyle  . Physical activity:    Days per week: Not on file    Minutes per session: Not on file  . Stress: Not on file  Relationships  . Social connections:    Talks on phone: Not on file    Gets together: Not on file  Attends religious service: Not on file    Active member of club or organization: Not on file    Attends meetings of clubs or organizations: Not on file    Relationship status: Not on file  . Intimate partner violence:    Fear of current or ex partner: Not on file    Emotionally abused: Not on file    Physically abused: Not on file    Forced sexual activity: Not on file  Other Topics Concern  . Not on file  Social History Narrative  . Not on file    Outpatient Medications Prior to Visit  Medication Sig Dispense Refill  . albuterol (PROVENTIL HFA) 108 (90 BASE) MCG/ACT inhaler Inhale 2 puffs into the lungs every 6 (six) hours as needed. 1-2 puffs q 4-6 hours prn for exercise/cough/wheeze/ SOB 18 g 3  . EPINEPHrine 0.3 mg/0.3 mL IJ SOAJ injection Inject 0.3 mLs (0.3 mg total) into the muscle once. 2 Device 1  . FLUoxetine (PROZAC) 10 MG tablet Take 1 tablet (10 mg total) by mouth daily. 30 tablet 4  . Probiotic  Product (PROBIOTIC DAILY PO) Take by mouth.    . ranitidine (ZANTAC) 300 MG capsule Take 1 capsule (300 mg total) by mouth every evening. 30 capsule 2  . clonazePAM (KLONOPIN) 0.5 MG tablet Take 1 tablet (0.5 mg total) by mouth as needed. For anxiety 30 tablet 3  . loratadine (CLARITIN) 10 MG tablet Take 1 tablet (10 mg total) by mouth daily. (Patient not taking: Reported on 09/03/2014) 30 tablet 5   No facility-administered medications prior to visit.     Allergies  Allergen Reactions  . Eggs Or Egg-Derived Products Swelling    Swelling in throat  . Peanut-Containing Drug Products Swelling    Throat swelling  . Beef-Derived Products   . Citalopram Nausea Only    insomnia  . Gluten Meal     Review of Systems  Constitutional: Negative for fever and malaise/fatigue.  HENT: Positive for congestion. Negative for ear pain and sinus pain.   Eyes: Negative for blurred vision.  Respiratory: Positive for shortness of breath. Negative for stridor.   Cardiovascular: Negative for chest pain, palpitations and leg swelling.  Gastrointestinal: Negative for abdominal pain, blood in stool and nausea.  Genitourinary: Negative for dysuria and frequency.  Musculoskeletal: Negative for falls.  Skin: Negative for rash.  Neurological: Negative for dizziness, loss of consciousness and headaches.  Endo/Heme/Allergies: Negative for environmental allergies.  Psychiatric/Behavioral: Negative for depression. The patient is not nervous/anxious.        Objective:    Physical Exam  Constitutional: She is oriented to person, place, and time. No distress.  HENT:  Head: Normocephalic and atraumatic.  Eyes: Conjunctivae are normal.  Neck: Neck supple. No thyromegaly present.  Cardiovascular: Normal rate, regular rhythm and normal heart sounds.  No murmur heard. Pulmonary/Chest: Effort normal and breath sounds normal. She has no wheezes.  Abdominal: She exhibits no distension and no mass.  Musculoskeletal:  She exhibits no edema.  Lymphadenopathy:    She has no cervical adenopathy.  Neurological: She is alert and oriented to person, place, and time.  Skin: Skin is warm and dry. No rash noted. She is not diaphoretic.  Psychiatric: Judgment normal.    BP 120/76 (BP Location: Left Arm, Patient Position: Sitting, Cuff Size: Normal)   Pulse 73   Temp 98.3 F (36.8 C) (Oral)   Resp 18   Ht 5\' 4"  (1.626 m)   Wt 191 lb  9.6 oz (86.9 kg)   LMP 08/13/2017 (Exact Date)   SpO2 99%   Breastfeeding? Unknown   BMI 32.89 kg/m  Wt Readings from Last 3 Encounters:  09/02/17 191 lb 9.6 oz (86.9 kg)  03/01/15 184 lb (83.5 kg)  09/03/14 194 lb 2 oz (88.1 kg)   BP Readings from Last 3 Encounters:  09/02/17 120/76  03/01/15 115/75  09/03/14 122/84     Immunization History  Administered Date(s) Administered  . Influenza,inj,Quad PF,6+ Mos 04/09/2014  . Tdap 04/09/2014    Health Maintenance  Topic Date Due  . HIV Screening  10/04/2001  . PAP SMEAR  10/05/2007  . INFLUENZA VACCINE  10/28/2017  . TETANUS/TDAP  04/09/2024    Lab Results  Component Value Date   WBC 5.9 04/09/2014   HGB 13.4 04/09/2014   HCT 41.4 04/09/2014   PLT 304.0 04/09/2014   GLUCOSE 92 04/09/2014   CHOL 127 07/04/2012   TRIG 55 07/04/2012   HDL 51 07/04/2012   LDLCALC 65 07/04/2012   ALT 19 04/09/2014   AST 21 04/09/2014   NA 137 04/09/2014   K 4.1 04/09/2014   CL 105 04/09/2014   CREATININE 0.8 04/09/2014   BUN 10 04/09/2014   CO2 25 04/09/2014   TSH 0.67 04/09/2014   HGBA1C 5.3 04/09/2014    Lab Results  Component Value Date   TSH 0.67 04/09/2014   Lab Results  Component Value Date   WBC 5.9 04/09/2014   HGB 13.4 04/09/2014   HCT 41.4 04/09/2014   MCV 87.0 04/09/2014   PLT 304.0 04/09/2014   Lab Results  Component Value Date   NA 137 04/09/2014   K 4.1 04/09/2014   CO2 25 04/09/2014   GLUCOSE 92 04/09/2014   BUN 10 04/09/2014   CREATININE 0.8 04/09/2014   BILITOT 0.4 04/09/2014   ALKPHOS  61 04/09/2014   AST 21 04/09/2014   ALT 19 04/09/2014   PROT 8.1 04/09/2014   ALBUMIN 4.3 04/09/2014   CALCIUM 9.4 04/09/2014   ANIONGAP 10 10/22/2013   GFR 89.81 04/09/2014   Lab Results  Component Value Date   CHOL 127 07/04/2012   Lab Results  Component Value Date   HDL 51 07/04/2012   Lab Results  Component Value Date   LDLCALC 65 07/04/2012   Lab Results  Component Value Date   TRIG 55 07/04/2012   Lab Results  Component Value Date   CHOLHDL 2.5 07/04/2012   Lab Results  Component Value Date   HGBA1C 5.3 04/09/2014         Assessment & Plan:   Problem List Items Addressed This Visit    Overweight    Encouraged DASH diet, decrease po intake and increase exercise as tolerated. Needs 7-8 hours of sleep nightly. Avoid trans fats, eat small, frequent meals every 4-5 hours with lean proteins, complex carbs and healthy fats. Minimize simple carbs      Depression with anxiety    Agrees to restart Fluoxetine 10 mg daily and given refill on Clonazepam to use prn. Return in 3-4 months or as needed      Allergic rhinitis    Add Singulair 10 mg daily      Asthma    Refill given on albuterol      Relevant Medications   montelukast (SINGULAIR) 10 MG tablet   GERD    Avoid offending foods, start probiotics. Do not eat large meals in late evening and consider raising head of bed. Had recent  episode of epigastric pain. 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      Epigastric pain   Relevant Orders   Lipase   Amylase   CBC w/Diff   Comprehensive metabolic panel   TSH    Other Visit Diagnoses    Cervical cancer screening    -  Primary   Relevant Orders   Ambulatory referral to Obstetrics / Gynecology   Anxiety and depression       Relevant Medications   clonazePAM (KLONOPIN) 0.5 MG tablet      I have discontinued Jeronica  Hickle's loratadine. I am also having her start on montelukast. Additionally, I am having her maintain her albuterol, ranitidine, Probiotic Product (PROBIOTIC DAILY PO), EPINEPHrine, FLUoxetine, and clonazePAM.  Meds ordered this encounter  Medications  . clonazePAM (KLONOPIN) 0.5 MG tablet    Sig: Take 1 tablet (0.5 mg total) by mouth as needed. For anxiety    Dispense:  30 tablet    Refill:  3  . montelukast (SINGULAIR) 10 MG tablet    Sig: Take 1 tablet (10 mg total) by mouth at bedtime as needed.    Dispense:  30 tablet    Refill:  3    CMA served as scribe during this visit. History, Physical and Plan performed by medical provider. Documentation and orders reviewed and attested to.  Danise EdgeStacey Margaret Cockerill, MD

## 2017-09-02 NOTE — Assessment & Plan Note (Signed)
She notes feeling much better when she avoids these foods.

## 2017-09-02 NOTE — Assessment & Plan Note (Signed)
Referred to gyncologist for pap

## 2017-09-02 NOTE — Assessment & Plan Note (Signed)
Add Singulair 10mg daily.  

## 2017-09-02 NOTE — Assessment & Plan Note (Signed)
Agrees to restart Fluoxetine 10 mg daily and given refill on Clonazepam to use prn. Return in 3-4 months or as needed

## 2017-09-02 NOTE — Assessment & Plan Note (Signed)
Refill given on albuterol

## 2017-09-03 ENCOUNTER — Telehealth: Payer: Self-pay | Admitting: Family Medicine

## 2017-09-03 LAB — LIPASE: LIPASE: 35 U/L (ref 11.0–59.0)

## 2017-09-03 LAB — CBC WITH DIFFERENTIAL/PLATELET
BASOS ABS: 0.1 10*3/uL (ref 0.0–0.1)
Basophils Relative: 0.8 % (ref 0.0–3.0)
Eosinophils Absolute: 0.2 10*3/uL (ref 0.0–0.7)
Eosinophils Relative: 2.3 % (ref 0.0–5.0)
HCT: 38.2 % (ref 36.0–46.0)
Hemoglobin: 12.9 g/dL (ref 12.0–15.0)
LYMPHS ABS: 2 10*3/uL (ref 0.7–4.0)
Lymphocytes Relative: 24.3 % (ref 12.0–46.0)
MCHC: 33.8 g/dL (ref 30.0–36.0)
MCV: 85.1 fl (ref 78.0–100.0)
MONO ABS: 0.7 10*3/uL (ref 0.1–1.0)
Monocytes Relative: 8.4 % (ref 3.0–12.0)
NEUTROS PCT: 64.2 % (ref 43.0–77.0)
Neutro Abs: 5.3 10*3/uL (ref 1.4–7.7)
Platelets: 312 10*3/uL (ref 150.0–400.0)
RBC: 4.48 Mil/uL (ref 3.87–5.11)
RDW: 13 % (ref 11.5–15.5)
WBC: 8.2 10*3/uL (ref 4.0–10.5)

## 2017-09-03 LAB — AMYLASE: Amylase: 69 U/L (ref 27–131)

## 2017-09-03 LAB — COMPREHENSIVE METABOLIC PANEL
ALK PHOS: 51 U/L (ref 39–117)
ALT: 16 U/L (ref 0–35)
AST: 18 U/L (ref 0–37)
Albumin: 4.4 g/dL (ref 3.5–5.2)
BILIRUBIN TOTAL: 0.3 mg/dL (ref 0.2–1.2)
BUN: 10 mg/dL (ref 6–23)
CO2: 23 mEq/L (ref 19–32)
CREATININE: 0.73 mg/dL (ref 0.40–1.20)
Calcium: 9.2 mg/dL (ref 8.4–10.5)
Chloride: 104 mEq/L (ref 96–112)
GFR: 98.89 mL/min (ref >60.00)
Glucose, Bld: 84 mg/dL (ref 70–99)
Potassium: 3.6 mEq/L (ref 3.5–5.1)
SODIUM: 137 meq/L (ref 135–145)
TOTAL PROTEIN: 7.6 g/dL (ref 6.0–8.3)

## 2017-09-03 LAB — TSH: TSH: 0.73 u[IU]/mL (ref 0.35–4.50)

## 2017-09-03 NOTE — Telephone Encounter (Signed)
Copied from CRM [id number]. Topic: Quick Communication - Rx Refill/Question >> Sep 03, 2017  3:36 PM Louie BunPalacios Medina, Rosey Batheresa D wrote: Medication: clonazePAM (KLONOPIN) 0.5 MG tablet  Has the patient contacted their pharmacy? Yes but pharmacy needs clarification on how pt needs to take it. Please call them back. (Agent: If no, request that the patient contact the pharmacy for the refill.) (Agent: If yes, when and what did the pharmacy advise?)  Preferred Pharmacy (with phone number or street name): [id number] - [place], [place] - [address]  Agent: Please be advised that RX refills may take up to 3 business days. We ask that you follow-up with your pharmacy.

## 2017-09-06 ENCOUNTER — Encounter: Payer: Self-pay | Admitting: Family Medicine

## 2017-09-06 NOTE — Telephone Encounter (Signed)
Clonazepam already refilled by Dr. Abner GreenspanBlyth on 6/6.

## 2017-09-07 ENCOUNTER — Other Ambulatory Visit: Payer: Self-pay | Admitting: Family Medicine

## 2017-09-07 DIAGNOSIS — F418 Other specified anxiety disorders: Secondary | ICD-10-CM

## 2017-09-07 MED ORDER — FLUOXETINE HCL 10 MG PO TABS: 10.0000 mg | ORAL_TABLET | Freq: Every day | ORAL | 4 refills | Status: DC

## 2017-11-06 ENCOUNTER — Encounter: Payer: Self-pay | Admitting: Family Medicine

## 2017-11-08 NOTE — Telephone Encounter (Signed)
Spoke with patient advised her per Dr. Abner GreenspanBlyth to stop her Klonipin,  She can take her prozac only a half of a tablet daily. Not to d/c  Singulair and zantac she can take as needed.   Patient is scheduled to come in on Friday for more testing and referral to Ob/Gyn

## 2017-11-12 ENCOUNTER — Encounter: Payer: Self-pay | Admitting: Family Medicine

## 2017-11-12 ENCOUNTER — Ambulatory Visit (INDEPENDENT_AMBULATORY_CARE_PROVIDER_SITE_OTHER): Payer: 59 | Admitting: Family Medicine

## 2017-11-12 VITALS — BP 120/82 | HR 68 | Temp 98.5°F | Resp 18 | Ht 64.0 in | Wt 190.2 lb

## 2017-11-12 DIAGNOSIS — Z3201 Encounter for pregnancy test, result positive: Secondary | ICD-10-CM | POA: Diagnosis not present

## 2017-11-12 DIAGNOSIS — N926 Irregular menstruation, unspecified: Secondary | ICD-10-CM

## 2017-11-12 DIAGNOSIS — F418 Other specified anxiety disorders: Secondary | ICD-10-CM

## 2017-11-12 DIAGNOSIS — R350 Frequency of micturition: Secondary | ICD-10-CM | POA: Diagnosis not present

## 2017-11-12 DIAGNOSIS — J309 Allergic rhinitis, unspecified: Secondary | ICD-10-CM

## 2017-11-12 LAB — URINALYSIS
Bilirubin Urine: NEGATIVE
HGB URINE DIPSTICK: NEGATIVE
Ketones, ur: NEGATIVE
LEUKOCYTES UA: NEGATIVE
Nitrite: NEGATIVE
Specific Gravity, Urine: <=1.005 — AB (ref 1.000–1.030)
Total Protein, Urine: NEGATIVE
Urine Glucose: 250 — AB
Urobilinogen, UA: 0.2 (ref 0.0–1.0)
pH: 6.5 (ref 5.0–8.0)

## 2017-11-12 LAB — HCG, QUANTITATIVE, PREGNANCY: Quantitative HCG: 9769 m[IU]/mL

## 2017-11-12 LAB — POCT URINE PREGNANCY: Preg Test, Ur: POSITIVE — AB

## 2017-11-12 NOTE — Assessment & Plan Note (Addendum)
[place] in [place] continue prenatal vitamins, avoid meds without checking. Counseled regarding symptoms to watch for and what to avoid for 25 minutes

## 2017-11-12 NOTE — Progress Notes (Signed)
Subjective:  I acted as a Neurosurgeon for Dr. Abner Greenspan. [place], [place]  Patient ID: Marisa Fletcher, female    DOB: 31-Jul-1985, 31 y.o.   MRN: [id number]  No chief complaint on file.   HPI  Patient is in today for evaluation of a positive pregnancy test. She is struggling with some nausea but no vomiting. Also notes some low back pain and some urinary frequency. No dysuria or abdominal pain.  Denies CP/palp/SOB/HA/congestion/fevers/GI  c/o. Taking meds as prescribed and has dropped her Prozac dose in 1/2 is not taking her Clonazepam and her allergy meds.   Patient Care Team: Bradd Canary, MD as PCP - General (Family Medicine)   Past Medical History:  Diagnosis Date  . Allergy   . 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 Asthma   . Depression   . Depression with anxiety 04/17/2010   Qualifier: Diagnosis of  By: Abner Greenspan MD, Misty Stanley    . 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 Preventative health care 07/09/2012    Past Surgical History:  Procedure Laterality Date  . CHOLECYSTECTOMY  2007   with pancreatitis  . wisdom teeth extracted      Family History  Problem Relation Age of Onset  . Cholelithiasis Father   . Allergies Father   . Asthma Father   . Anxiety disorder Sister   . ADD / ADHD Brother        ADD  . Memory loss Maternal Grandmother   . Depression Maternal Grandmother        possible bipolar  . Obesity Maternal Grandmother   . Alcohol abuse Maternal Grandfather   . Cancer Paternal Grandmother   . Heart disease Paternal Grandmother   . Asthma Paternal Grandfather   . Allergies Paternal Grandfather   . Cholelithiasis Paternal Grandfather   . ADD / ADHD Sister        ADD  . Other Mother        Back disease    Social History   Socioeconomic History  . Marital status: Married    Spouse name: Not on file  . Number of children: Not on file  . Years of education: Not on file  . Highest education level: Not on file  Occupational History  . Not on file  Social Needs  . Financial resource  strain: Not on file  . Food insecurity:    Worry: Not on file    Inability: Not on file  . Transportation needs:    Medical: Not on file    Non-medical: Not on file  Tobacco Use  . Smoking status: Former Smoker    Packs/day: 0.10    Last attempt to quit: 12/28/2009    Years since quitting: 7.8  . Smokeless tobacco: Never Used  Substance and Sexual Activity  . Alcohol use: Yes    Alcohol/week: 0.0 standard drinks  . Drug use: No  . Sexual activity: Yes    Birth control/protection: None  Lifestyle  . Physical activity:    Days per week: Not on file    Minutes per session: Not on file  . Stress: Not on file  Relationships  . Social connections:    Talks on phone: Not on file    Gets together: Not on file    Attends religious service: Not on file    Active member of club or organization: Not on file    Attends meetings of clubs or organizations: Not on file    Relationship status: Not on file  .  Intimate partner violence:    Fear of current or ex partner: Not on file    Emotionally abused: Not on file    Physically abused: Not on file    Forced sexual activity: Not on file  Other Topics Concern  . Not on file  Social History Narrative  . Not on file    Outpatient Medications Prior to Visit  Medication Sig Dispense Refill  . EPINEPHrine 0.3 mg/0.3 mL IJ SOAJ injection Inject 0.3 mLs (0.3 mg total) into the muscle once. 2 Device 1  . FLUoxetine (PROZAC) 10 MG tablet Take 1 tablet (10 mg total) by mouth daily. 30 tablet 4  . Prenatal Vit-Fe Fumarate-FA (MULTIVITAMIN-PRENATAL) 27-0.8 MG TABS tablet Take 1 tablet by mouth daily at 12 noon.    Marland Kitchen albuterol (PROVENTIL HFA) 108 (90 BASE) MCG/ACT inhaler Inhale 2 puffs into the lungs every 6 (six) hours as needed. 1-2 puffs q 4-6 hours prn for exercise/cough/wheeze/ SOB (Patient not taking: Reported on 11/12/2017) 18 g 3  . clonazePAM (KLONOPIN) 0.5 MG tablet Take 1 tablet (0.5 mg total) by mouth as needed. For anxiety (Patient not  taking: Reported on 11/12/2017) 30 tablet 3  . montelukast (SINGULAIR) 10 MG tablet Take 1 tablet (10 mg total) by mouth at bedtime as needed. (Patient not taking: Reported on 11/12/2017) 30 tablet 3  . Probiotic Product (PROBIOTIC DAILY PO) Take by mouth.    . ranitidine (ZANTAC) 300 MG capsule Take 1 capsule (300 mg total) by mouth every evening. (Patient not taking: Reported on 11/12/2017) 30 capsule 2   No facility-administered medications prior to visit.     Allergies  Allergen Reactions  . Eggs Or Egg-Derived Products Swelling    Swelling in throat  . Peanut-Containing Drug Products Swelling    Throat swelling  . Beef-Derived Products   . Citalopram Nausea Only    insomnia  . Gluten Meal     Review of Systems  Constitutional: Negative for fever and malaise/fatigue.  HENT: Negative for congestion.   Eyes: Negative for blurred vision.  Respiratory: Negative for shortness of breath.   Cardiovascular: Negative for chest pain, palpitations and leg swelling.  Gastrointestinal: Positive for nausea. Negative for abdominal pain and blood in stool.  Genitourinary: Positive for frequency and urgency. Negative for dysuria.  Musculoskeletal: Negative for falls.  Skin: Negative for rash.  Neurological: Negative for dizziness, loss of consciousness and headaches.  Endo/Heme/Allergies: Negative for environmental allergies.  Psychiatric/Behavioral: Negative for depression. The patient is not nervous/anxious.        Objective:    Physical Exam  Constitutional: She is oriented to person, place, and time. She appears well-developed and well-nourished. No distress.  HENT:  Head: Normocephalic and atraumatic.  Nose: Nose normal.  Eyes: Right eye exhibits no discharge. Left eye exhibits no discharge.  Neck: Normal range of motion. Neck supple.  Cardiovascular: Normal rate and regular rhythm.  No murmur heard. Pulmonary/Chest: Effort normal and breath sounds normal.  Abdominal: Soft. Bowel  sounds are normal. There is no tenderness.  Musculoskeletal: She exhibits no edema.  Neurological: She is alert and oriented to person, place, and time.  Skin: Skin is warm and dry.  Psychiatric: She has a normal mood and affect.  Nursing note and vitals reviewed.   BP 120/82 (BP Location: Left Arm, Patient Position: Sitting, Cuff Size: Normal)   Pulse 68   Temp 98.5 F (36.9 C) (Oral)   Resp 18   Ht 5\' 4"  (1.626 m)   Wt  190 lb 3.2 oz (86.3 kg)   LMP 11/06/2017 (Exact Date)   SpO2 98%   BMI 32.65 kg/m  Wt Readings from Last 3 Encounters:  11/12/17 190 lb 3.2 oz (86.3 kg)  09/02/17 191 lb 9.6 oz (86.9 kg)  03/01/15 184 lb (83.5 kg)   BP Readings from Last 3 Encounters:  11/12/17 120/82  09/02/17 120/76  03/01/15 115/75     Immunization History  Administered Date(s) Administered  . Influenza,inj,Quad PF,6+ Mos 04/09/2014  . Tdap 04/09/2014    Health Maintenance  Topic Date Due  . HIV Screening  10/04/2001  . PAP SMEAR  10/05/2007  . INFLUENZA VACCINE  10/28/2017  . TETANUS/TDAP  04/09/2024    Lab Results  Component Value Date   WBC 8.2 09/02/2017   HGB 12.9 09/02/2017   HCT 38.2 09/02/2017   PLT 312.0 09/02/2017   GLUCOSE 84 09/02/2017   CHOL 127 07/04/2012   TRIG 55 07/04/2012   HDL 51 07/04/2012   LDLCALC 65 07/04/2012   ALT 16 09/02/2017   AST 18 09/02/2017   NA 137 09/02/2017   K 3.6 09/02/2017   CL 104 09/02/2017   CREATININE 0.73 09/02/2017   BUN 10 09/02/2017   CO2 23 09/02/2017   TSH 0.73 09/02/2017   HGBA1C 5.3 04/09/2014    Lab Results  Component Value Date   TSH 0.73 09/02/2017   Lab Results  Component Value Date   WBC 8.2 09/02/2017   HGB 12.9 09/02/2017   HCT 38.2 09/02/2017   MCV 85.1 09/02/2017   PLT 312.0 09/02/2017   Lab Results  Component Value Date   NA 137 09/02/2017   K 3.6 09/02/2017   CO2 23 09/02/2017   GLUCOSE 84 09/02/2017   BUN 10 09/02/2017   CREATININE 0.73 09/02/2017   BILITOT 0.3 09/02/2017   ALKPHOS  51 09/02/2017   AST 18 09/02/2017   ALT 16 09/02/2017   PROT 7.6 09/02/2017   ALBUMIN 4.4 09/02/2017   CALCIUM 9.2 09/02/2017   ANIONGAP 10 10/22/2013   GFR 98.89 09/02/2017   Lab Results  Component Value Date   CHOL 127 07/04/2012   Lab Results  Component Value Date   HDL 51 07/04/2012   Lab Results  Component Value Date   LDLCALC 65 07/04/2012   Lab Results  Component Value Date   TRIG 55 07/04/2012   Lab Results  Component Value Date   CHOLHDL 2.5 07/04/2012   Lab Results  Component Value Date   HGBA1C 5.3 04/09/2014         Assessment & Plan:   Problem List Items Addressed This Visit    Depression with anxiety    No Clonazepam during pregnancy. Has dropped the Fluoxetine to 1/2 tab and she will discuss with GYN about whether to continue      Allergic rhinitis    Doing well without meds continue the same      Positive pregnancy test    [place] continue prenatal vitamins, avoid meds without checking. Counseled regarding symptoms to watch for and what to avoid for 25 minutes      Relevant Orders   B-HCG Quant (Completed)   Ambulatory referral to Obstetrics / Gynecology   Urinary frequency    Check UA with c and s      Relevant Orders   Urinalysis (Completed)   Urine Culture    Other Visit Diagnoses    Missed period    -  Primary  Relevant Orders   B-HCG Quant (Completed)   POCT urine pregnancy (Completed)      I have discontinued Payson Kloster's albuterol, ranitidine, Probiotic Product (PROBIOTIC DAILY PO), clonazePAM, and montelukast. I am also having her maintain her EPINEPHrine, FLUoxetine, and multivitamin-prenatal.  No orders of the defined types were placed in this encounter.   CMA served as Neurosurgeonscribe during this visit. History, Physical and Plan performed by medical provider. Documentation and orders reviewed and attested to.  Danise EdgeStacey Reon Hunley, MD

## 2017-11-12 NOTE — Assessment & Plan Note (Addendum)
No Clonazepam during pregnancy. Has dropped the Fluoxetine to 1/2 tab and she will discuss with GYN about whether to continue

## 2017-11-12 NOTE — Patient Instructions (Signed)
Common Medications Safe in Pregnancy  Acne:      Constipation:  Benzoyl Peroxide     Colace  Clindamycin      Dulcolax Suppository  Topica Erythromycin     Fibercon  Salicylic Acid      Metamucil         Miralax AVOID:        Senakot   Accutane    Cough:  Retin-A       Cough Drops  Tetracycline      Phenergan w/ Codeine if Rx  Minocycline      Robitussin (Plain & DM)  Antibiotics:     Crabs/Lice:  Ceclor       RID  Cephalosporins    AVOID:  E-Mycins      Kwell  Keflex  Macrobid/Macrodantin   Diarrhea:  Penicillin      Kao-Pectate  Zithromax      Imodium AD         PUSH FLUIDS AVOID:       Cipro     Fever:  Tetracycline      Tylenol (Regular or Extra  Minocycline       Strength)  Levaquin      Extra Strength-Do not          Exceed 8 tabs/24 hrs Caffeine:        <200mg/day (equiv. To 1 cup of coffee or  approx. 3 12 oz sodas)         Gas: Cold/Hayfever:       Gas-X  Benadryl      Mylicon  Claritin       Phazyme  **Claritin-D        Chlor-Trimeton    Headaches:  Dimetapp      ASA-Free Excedrin  Drixoral-Non-Drowsy     Cold Compress  Mucinex (Guaifenasin)     Tylenol (Regular or Extra  Sudafed/Sudafed-12 Hour     Strength)  **Sudafed PE Pseudoephedrine   Tylenol Cold & Sinus     Vicks Vapor Rub  Zyrtec  **AVOID if Problems With Blood Pressure         Heartburn: Avoid lying down for at least 1 hour after meals  Aciphex      Maalox     Rash:  Milk of Magnesia     Benadryl    Mylanta       1% Hydrocortisone Cream  Pepcid  Pepcid Complete   Sleep Aids:  Prevacid      Ambien   Prilosec       Benadryl  Rolaids       Chamomile Tea  Tums (Limit 4/day)     Unisom  Zantac       Tylenol PM         Warm milk-add vanilla or  Hemorrhoids:       Sugar for taste  Anusol/Anusol H.C.  (RX: Analapram 2.5%)  Sugar Substitutes:  Hydrocortisone OTC     Ok in moderation  Preparation H      Tucks        Vaseline lotion applied to tissue with  wiping    Herpes:     Throat:  Acyclovir      Oragel  Famvir  Valtrex     Vaccines:         Flu Shot Leg Cramps:       *Gardasil  Benadryl      Hepatitis A         Hepatitis B Nasal Spray:         Pneumovax  Saline Nasal Spray     Polio Booster         Tetanus Nausea:       Tuberculosis test or PPD  Vitamin B6 25 mg TID   AVOID:    Dramamine      *Gardasil  Emetrol       Live Poliovirus  Ginger Root 250 mg QID    MMR (measles, mumps &  High Complex Carbs @ Bedtime    rebella)  Sea Bands-Accupressure    Varicella (Chickenpox)  Unisom 1/2 tab TID     *No known complications           If received before Pain:         Known pregnancy;   Darvocet       Resume series after  Lortab        Delivery  Percocet    Yeast:   Tramadol      Femstat  Tylenol 3      Gyne-lotrimin  Ultram       Monistat  Vicodin           MISC:         All Sunscreens           Hair Coloring/highlights          Insect Repellant's          (Including DEET)         Mystic Tans Commonly Asked Questions During Pregnancy  Cats: A parasite can be excreted in cat feces.  To avoid exposure you need to have another person empty the little box.  If you must empty the litter box you will need to wear gloves.  Wash your hands after handling your cat.  This parasite can also be found in raw or undercooked meat so this should also be avoided.  Colds, Sore Throats, Flu: Please check your medication sheet to see what you can take for symptoms.  If your symptoms are unrelieved by these medications please call the office.  Dental Work: Most any dental work Investment banker, corporate recommends is permitted.  X-rays should only be taken during the first trimester if absolutely necessary.  Your abdomen should be shielded with a lead apron during all x-rays.  Please notify your provider prior to receiving any x-rays.  Novocaine is fine; gas is not recommended.  If your dentist requires a note from [place] prior to dental work please call the office  and we will provide one for you.  Exercise: Exercise is an important part of staying healthy during your pregnancy.  You may continue most exercises you were accustomed to prior to pregnancy.  Later in your pregnancy you will most likely notice you have difficulty with activities requiring balance like riding a bicycle.  It is important that you listen to your body and avoid activities that put you at a higher risk of falling.  Adequate rest and staying well hydrated are a must!  If you have questions about the safety of specific activities ask your provider.    Exposure to Children with illness: Try to avoid obvious exposure; report any symptoms to [place] when noted,  If you have chicken pos, red measles or mumps, you should be immune to these diseases.   Please do not take any vaccines while pregnant unless you have checked with your OB provider.  Fetal Movement: After 28 weeks we recommend you do "kick counts" twice daily.  Lie or sit down in a calm  quiet environment and count your baby movements "kicks".  You should feel your baby at least 10 times per hour.  If you have not felt 10 kicks within the first hour get up, walk around and have something sweet to eat or drink then repeat for an additional hour.  If count remains less than 10 per hour notify your provider.  Fumigating: Follow your pest control agent's advice as to how long to stay out of your home.  Ventilate the area well before re-entering.  Hemorrhoids:   Most over-the-counter preparations can be used during pregnancy.  Check your medication to see what is safe to use.  It is important to use a stool softener or fiber in your diet and to drink lots of liquids.  If hemorrhoids seem to be getting worse please call the office.   Hot Tubs:  Hot tubs Jacuzzis and saunas are not recommended while pregnant.  These increase your internal body temperature and should be avoided.  Intercourse:  Sexual intercourse is safe during pregnancy as long as  you are comfortable, unless otherwise advised by your provider.  Spotting may occur after intercourse; report any bright red bleeding that is heavier than spotting.  Labor:  If you know that you are in labor, please go to the hospital.  If you are unsure, please call the office and let us help you decide what to do.  Lifting, straining, etc:  If your job requires heavy lifting or straining please check with your provider for any limitations.  Generally, you should not lift items heavier than that you can lift simply with your hands and arms (no back muscles)  Painting:  Paint fumes do not harm your pregnancy, but may make you ill and should be avoided if possible.  Latex or water based paints have less odor than oils.  Use adequate ventilation while painting.  Permanents & Hair Color:  Chemicals in hair dyes are not recommended as they cause increase hair dryness which can increase hair loss during pregnancy.  " Highlighting" and permanents are allowed.  Dye may be absorbed differently and permanents may not hold as well during pregnancy.  Sunbathing:  Use a sunscreen, as skin burns easily during pregnancy.  Drink plenty of fluids; avoid over heating.  Tanning Beds:  Because their possible side effects are still unknown, tanning beds are not recommended.  Ultrasound Scans:  Routine ultrasounds are performed at approximately 20 weeks.  You will be able to see your baby's general anatomy an if you would like to know the gender this can usually be determined as well.  If it is questionable when you conceived you may also receive an ultrasound early in your pregnancy for dating purposes.  Otherwise ultrasound exams are not routinely performed unless there is a medical necessity.  Although you can request a scan we ask that you pay for it when conducted because insurance does not cover " patient request" scans.  Work: If your pregnancy proceeds without complications you may work until your due date,  unless your physician or employer advises otherwise.  Round Ligament Pain/Pelvic Discomfort:  Sharp, shooting pains not associated with bleeding are fairly common, usually occurring in the second trimester of pregnancy.  They tend to be worse when standing up or when you remain standing for long periods of time.  These are the result of pressure of certain pelvic ligaments called "round ligaments".  Rest, Tylenol and heat seem to be the most effective relief.  As the  womb and fetus grow, they rise out of the pelvis and the discomfort improves.  Please notify the office if your pain seems different than that described.  It may represent a more serious condition.

## 2017-11-12 NOTE — Assessment & Plan Note (Signed)
Check UA with c and s 

## 2017-11-12 NOTE — Assessment & Plan Note (Signed)
Doing well without meds continue the same

## 2017-11-13 LAB — URINE CULTURE
MICRO NUMBER:: 90977095
SPECIMEN QUALITY:: ADEQUATE

## 2017-11-16 ENCOUNTER — Encounter: Payer: Self-pay | Admitting: Family Medicine

## 2017-11-17 ENCOUNTER — Encounter: Payer: Self-pay | Admitting: Family Medicine

## 2017-12-14 ENCOUNTER — Encounter: Payer: Self-pay | Admitting: *Deleted

## 2017-12-15 ENCOUNTER — Encounter: Payer: Self-pay | Admitting: Obstetrics & Gynecology

## 2017-12-15 ENCOUNTER — Ambulatory Visit (INDEPENDENT_AMBULATORY_CARE_PROVIDER_SITE_OTHER): Payer: 59 | Admitting: Obstetrics & Gynecology

## 2017-12-15 VITALS — BP 129/79 | HR 81 | Wt 190.0 lb

## 2017-12-15 DIAGNOSIS — Z3490 Encounter for supervision of normal pregnancy, unspecified, unspecified trimester: Secondary | ICD-10-CM

## 2017-12-15 DIAGNOSIS — Z113 Encounter for screening for infections with a predominantly sexual mode of transmission: Secondary | ICD-10-CM

## 2017-12-15 DIAGNOSIS — Z3481 Encounter for supervision of other normal pregnancy, first trimester: Secondary | ICD-10-CM

## 2017-12-15 DIAGNOSIS — R81 Glycosuria: Secondary | ICD-10-CM | POA: Diagnosis not present

## 2017-12-15 DIAGNOSIS — Z3687 Encounter for antenatal screening for uncertain dates: Secondary | ICD-10-CM | POA: Diagnosis not present

## 2017-12-15 DIAGNOSIS — Z3400 Encounter for supervision of normal first pregnancy, unspecified trimester: Secondary | ICD-10-CM

## 2017-12-15 DIAGNOSIS — T7840XA Allergy, unspecified, initial encounter: Secondary | ICD-10-CM

## 2017-12-15 MED ORDER — EPINEPHRINE 0.3 MG/0.3ML IJ SOAJ: 0.3000 mg | Freq: Once | INTRAMUSCULAR | 1 refills | Status: AC

## 2017-12-15 MED ORDER — DOXYLAMINE-PYRIDOXINE 10-10 MG PO TBEC: 2.0000 | DELAYED_RELEASE_TABLET | Freq: Every day | ORAL | 2 refills | Status: DC

## 2017-12-15 NOTE — Progress Notes (Signed)
Last pap smear- more than 3 years ago- normal

## 2017-12-15 NOTE — Progress Notes (Signed)
Subjective:    Marisa Fletcher is a G2P0010 [redacted]w[redacted]d being seen today for her first obstetrical visit.  Her obstetrical history is significant for 7 week miscarriage, obesity, anxiety.   Patient does intend to breast feed. Pregnancy history fully reviewed.  Patient reports nausea and vomiting.  Vitals:   12/15/17 0950  BP: 129/79  Pulse: 81  Weight: 190 lb (86.2 kg)    HISTORY: OB History  Gravida Para Term Preterm AB Living  2       1    SAB TAB Ectopic Multiple Live Births  1            # Outcome Date GA Lbr Len/2nd Weight Sex Delivery Anes PTL Lv  2 Current           1 SAB            Past Medical History:  Diagnosis Date  . Allergy   . 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 Asthma   . Depression   . Depression with anxiety 04/17/2010   Qualifier: Diagnosis of  By: Abner Greenspan MD, Misty Stanley    . 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 Preventative health care 07/09/2012   Past Surgical History:  Procedure Laterality Date  . CHOLECYSTECTOMY  2007   with pancreatitis  . wisdom teeth extracted     Family History  Problem Relation Age of Onset  . Cholelithiasis Father   . Allergies Father   . Asthma Father   . Anxiety disorder Sister   . ADD / ADHD Brother        ADD  . Memory loss Maternal Grandmother   . Depression Maternal Grandmother        possible bipolar  . Obesity Maternal Grandmother   . Alcohol abuse Maternal Grandfather   . Cancer Paternal Grandmother   . Heart disease Paternal Grandmother   . Asthma Paternal Grandfather   . Allergies Paternal Grandfather   . Cholelithiasis Paternal Grandfather   . ADD / ADHD Sister        ADD  . Other Mother        Back disease     Exam    Uterus:     Pelvic Exam:    Perineum: No Hemorrhoids   Vulva: normal   Vagina:  Deferred speculum exam   pH: Deferred speculum exam   Cervix: Deferred speculum exam; closed on digital exam   Adnexa: normal adnexa   Bony Pelvis: average  System: Breast:  normal appearance, no masses or tenderness   Skin: normal coloration and turgor, no rashes    Neurologic: oriented, normal mood   Extremities: normal strength, tone, and muscle mass   HEENT sclera clear, anicteric, oropharynx clear, no lesions, neck supple with midline trachea, thyroid without masses and trachea midline   Mouth/Teeth mucous membranes moist, pharynx normal without lesions and dental hygiene good   Neck supple and no masses   Cardiovascular: regular rate and rhythm   Respiratory:  appears well, vitals normal, no respiratory distress, acyanotic, normal RR, chest clear, no wheezing, crepitations, rhonchi, normal symmetric air entry   Abdomen: soft, non-tender; bowel sounds normal; no masses,  no organomegaly   Urinary: urethral meatus normal      Assessment:    Pregnancy: G2P0010 Patient Active Problem List   Diagnosis Date Noted  . Positive pregnancy test 11/12/2017  . Urinary frequency 11/12/2017  . Epigastric pain 09/02/2017  . 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 Preventative health care 07/09/2012  .  Overweight 04/17/2010  . Depression with anxiety 04/17/2010  . DECREASED HEARING, RIGHT EAR 04/17/2010  . Allergic rhinitis 04/17/2010  . Asthma 04/17/2010  . GERD 04/17/2010  . PANCREATITIS, HX OF 04/17/2010        Plan:   Initial labs drawn. Prenatal vitamins. Problem list reviewed and updated. Genetics:  NIPS and AFP Anatomy US 18-20 weeks Defer pap smear until 20 weeks: pt ansiuos about having vaginal bleeding and reminding her of miscarriage Anxiety--no meds now; Pt encouraged to keep us abreast of her symptoms.  GAD 9 at next visit. Obesity in pregnancy--12-20 pound weight gain Hgb A1C Last TSH nml Baby Rx opt schedule  Elsie LincolnKelly Marguerita Stapp 12/15/2017

## 2017-12-15 NOTE — Progress Notes (Signed)
Bedside U/S shows single IUP with FHT of 185 BPM and CRL measures 31.808mm  GA is 10w

## 2017-12-16 LAB — CERVICOVAGINAL ANCILLARY ONLY
Chlamydia: NEGATIVE
NEISSERIA GONORRHEA: NEGATIVE

## 2017-12-17 LAB — HEMOGLOBINOPATHY EVALUATION
HGB A: 97.7 % (ref 96.4–98.8)
HGB C: 0 %
HGB S: 0 %
HGB VARIANT: 0 %
Hemoglobin A2 Quantitation: 2.3 % (ref 1.8–3.2)
Hemoglobin F Quantitation: 0 % (ref 0.0–2.0)

## 2017-12-17 LAB — URINE CULTURE, OB REFLEX

## 2017-12-17 LAB — CULTURE, OB URINE

## 2017-12-23 LAB — SMN1 COPY NUMBER ANALYSIS (SMA CARRIER SCREENING)

## 2017-12-25 LAB — OBSTETRIC PANEL
ANTIBODY SCREEN: NOT DETECTED
BASOS PCT: 0.3 %
Basophils Absolute: 20 cells/uL (ref 0–200)
EOS ABS: 92 {cells}/uL (ref 15–500)
Eosinophils Relative: 1.4 %
HCT: 37.7 % (ref 35.0–45.0)
Hemoglobin: 12.8 g/dL (ref 11.7–15.5)
Hepatitis B Surface Ag: NONREACTIVE
Lymphs Abs: 1247 cells/uL (ref 850–3900)
MCH: 28.6 pg (ref 27.0–33.0)
MCHC: 34 g/dL (ref 32.0–36.0)
MCV: 84.2 fL (ref 80.0–100.0)
MPV: 9.7 fL (ref 7.5–12.5)
Monocytes Relative: 10.1 %
Neutro Abs: 4574 cells/uL (ref 1500–7800)
Neutrophils Relative %: 69.3 %
PLATELETS: 292 10*3/uL (ref 140–400)
RBC: 4.48 10*6/uL (ref 3.80–5.10)
RDW: 12.8 % (ref 11.0–15.0)
RPR Ser Ql: NONREACTIVE
RUBELLA: 2.48 {index}
Total Lymphocyte: 18.9 %
WBC mixed population: 667 cells/uL (ref 200–950)
WBC: 6.6 10*3/uL (ref 3.8–10.8)

## 2017-12-25 LAB — CYSTIC FIBROSIS DIAGNOSTIC STUDY

## 2017-12-25 LAB — HIV ANTIBODY (ROUTINE TESTING W REFLEX): HIV 1&2 Ab, 4th Generation: NONREACTIVE

## 2017-12-25 LAB — HEMOGLOBIN A1C
HEMOGLOBIN A1C: 5.3 %{Hb} (ref <5.7)
MEAN PLASMA GLUCOSE: 105 (calc)
eAG (mmol/L): 5.8 (calc)

## 2017-12-30 ENCOUNTER — Encounter: Payer: Self-pay | Admitting: *Deleted

## 2017-12-30 NOTE — Progress Notes (Signed)
Received PA for Diclegis for period 12/29/17-12/30/18

## 2018-01-03 ENCOUNTER — Encounter: Payer: Self-pay | Admitting: Family Medicine

## 2018-01-20 ENCOUNTER — Inpatient Hospital Stay (HOSPITAL_BASED_OUTPATIENT_CLINIC_OR_DEPARTMENT_OTHER): Payer: 59

## 2018-01-20 ENCOUNTER — Encounter (HOSPITAL_COMMUNITY): Payer: Self-pay

## 2018-01-20 ENCOUNTER — Inpatient Hospital Stay (HOSPITAL_COMMUNITY)
Admission: AD | Admit: 2018-01-20 | Discharge: 2018-01-20 | Disposition: A | Discharge status: Home / Self Care | Payer: 59 | Source: Ambulatory Visit | Attending: Obstetrics and Gynecology | Admitting: Obstetrics and Gynecology

## 2018-01-20 DIAGNOSIS — R1030 Lower abdominal pain, unspecified: Secondary | ICD-10-CM | POA: Diagnosis present

## 2018-01-20 DIAGNOSIS — Z3A15 15 weeks gestation of pregnancy: Secondary | ICD-10-CM | POA: Diagnosis not present

## 2018-01-20 DIAGNOSIS — O021 Missed abortion: Secondary | ICD-10-CM | POA: Diagnosis present

## 2018-01-20 DIAGNOSIS — O209 Hemorrhage in early pregnancy, unspecified: Secondary | ICD-10-CM | POA: Insufficient documentation

## 2018-01-20 DIAGNOSIS — O26892 Other specified pregnancy related conditions, second trimester: Secondary | ICD-10-CM

## 2018-01-20 DIAGNOSIS — Z87891 Personal history of nicotine dependence: Secondary | ICD-10-CM | POA: Diagnosis not present

## 2018-01-20 DIAGNOSIS — R109 Unspecified abdominal pain: Secondary | ICD-10-CM | POA: Diagnosis not present

## 2018-01-20 DIAGNOSIS — O4692 Antepartum hemorrhage, unspecified, second trimester: Secondary | ICD-10-CM | POA: Diagnosis not present

## 2018-01-20 HISTORY — DX: Calculus of gallbladder without cholecystitis without obstruction: K80.20

## 2018-01-20 HISTORY — DX: Acute pancreatitis without necrosis or infection, unspecified: K85.90

## 2018-01-20 LAB — URINALYSIS, ROUTINE W REFLEX MICROSCOPIC
Bilirubin Urine: NEGATIVE
Glucose, UA: 50 mg/dL — AB
KETONES UR: NEGATIVE mg/dL
LEUKOCYTES UA: NEGATIVE
Nitrite: NEGATIVE
PH: 6 (ref 5.0–8.0)
Protein, ur: NEGATIVE mg/dL
Specific Gravity, Urine: 1.003 — ABNORMAL LOW (ref 1.005–1.030)

## 2018-01-20 MED ORDER — OXYCODONE-ACETAMINOPHEN 5-325 MG PO TABS: 2.0000 | ORAL_TABLET | Freq: Once | ORAL | Status: DC

## 2018-01-20 MED ORDER — TRAMADOL HCL 50 MG PO TABS: 50.0000 mg | ORAL_TABLET | Freq: Four times a day (QID) | ORAL | 0 refills | Status: DC | PRN

## 2018-01-20 NOTE — MAU Note (Signed)
Pt reports she started having some sharp, constant lower abdominal pain that started earlier this morning but got worse at work. Pt states she thought it was growing, stretching pain and kept work. Noticed some vaginal bleeding around 5pm when wiping. Started out pink but got darker. Denies passing clots. Denies recent intercourse or vaginal exams.

## 2018-01-20 NOTE — MAU Provider Note (Signed)
History     CSN: [id number]  Arrival date and time: 01/20/18 1900   First Provider Initiated Contact with Patient 01/20/18 1940      Chief Complaint  Patient presents with  . Abdominal Pain  . Vaginal Bleeding   HPI  Ms.  Marisa Fletcher is a 32 y.o. year old G3P0010 female at [redacted]w[redacted]d weeks gestation by LMP who presents to MAU reporting sharp, lower abdominal and lower back pain that started at 1000 when she woke up, but the pain got worse around 1300, she kept working thinking it was "just growing/stretching" pain. She reports pink vaginal spotting with wiping around 1700 that has gotten darker red. She denies blood clots, recent SI, or vaginal exams.    Past Medical History:  Diagnosis Date  . Allergy   . 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 Asthma   . Cholelithiasis   . Depression   . Depression with anxiety 04/17/2010   Qualifier: Diagnosis of  By: Abner Greenspan MD, Misty Stanley    . 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 Pancreatitis   . Preventative health care 07/09/2012    Past Surgical History:  Procedure Laterality Date  . CHOLECYSTECTOMY  2007   with pancreatitis  . wisdom teeth extracted      Family History  Problem Relation Age of Onset  . Cholelithiasis Father   . Allergies Father   . Asthma Father   . Anxiety disorder Sister   . ADD / ADHD Brother        ADD  . Memory loss Maternal Grandmother   . Depression Maternal Grandmother        possible bipolar  . Obesity Maternal Grandmother   . Alcohol abuse Maternal Grandfather   . Cancer Paternal Grandmother   . Heart disease Paternal Grandmother   . Asthma Paternal Grandfather   . Allergies Paternal Grandfather   . Cholelithiasis Paternal Grandfather   . ADD / ADHD Sister        ADD  . Other Mother        Back disease    Social History   Tobacco Use  . Smoking status: Former Smoker    Packs/day: 0.10    Last attempt to quit: 12/28/2009    Years since quitting: 8.0  . Smokeless tobacco: Never Used  Substance Use Topics  .  Alcohol use: Not Currently    Alcohol/week: 0.0 standard drinks  . Drug use: No    Allergies:  Allergies  Allergen Reactions  . Eggs Or Egg-Derived Products Swelling    Swelling in throat  . Peanut-Containing Drug Products Swelling    Throat swelling  . Beef-Derived Products   . Citalopram Nausea Only    insomnia  . Gluten Meal     Medications Prior to Admission  Medication Sig Dispense Refill Last Dose  . Prenatal Vit-Fe Fumarate-FA (MULTIVITAMIN-PRENATAL) 27-0.8 MG TABS tablet Take 1 tablet by mouth daily at 12 noon.   01/19/2018 at Unknown time  . Doxylamine-Pyridoxine (DICLEGIS) 10-10 MG TBEC Take 2 tablets by mouth at bedtime. 60 tablet 2     Review of Systems  Constitutional: Negative.   HENT: Negative.   Eyes: Negative.   Respiratory: Negative.   Cardiovascular: Negative.   Gastrointestinal: Negative.   Endocrine: Negative.   Genitourinary: Positive for pelvic pain (sharp, constant lower pain; "it seems like it's from hip to hip") and vaginal bleeding.  Musculoskeletal: Positive for back pain.  Skin: Negative.   Allergic/Immunologic: Negative.   Neurological: Negative.   Hematological: Negative.  Psychiatric/Behavioral: Negative.    Physical Exam   Blood pressure 131/74, pulse 65, temperature 99 F (37.2 C), temperature source Oral, resp. rate 17, height 5\' 4"  (1.626 m), weight 87.1 kg, last menstrual period 10/06/2017, SpO2 100 %.  Physical Exam  Nursing note and vitals reviewed. Constitutional: She is oriented to person, place, and time. She appears well-developed and well-nourished.  HENT:  Head: Normocephalic and atraumatic.  Eyes: Pupils are equal, round, and reactive to light.  Neck: Normal range of motion.  Cardiovascular: Normal rate.  Respiratory: Effort normal.  GI: Soft.  Genitourinary:  Genitourinary Comments: Uterus: gravid, S=D, SE: cervix is smooth, pink, no lesions, small amt of dark, brownish blood in vaginal vault, closed/long/firm, no  CMT or friability, no adnexal tenderness   Musculoskeletal: Normal range of motion.  Neurological: She is alert and oriented to person, place, and time.  Skin: Skin is warm and dry.  Psychiatric: She has a normal mood and affect. Her behavior is normal. Judgment and thought content normal.    MAU Course  Procedures  MDM Speculum Exam OB MFM Limited U/S Offer Percocet 5/325 mg 2 tablets -- declined, pt states she would "rather take medication at home and be d/c'd home at this time"  *Consult with Dr. Jolayne Panther @ 2035 - notified of patient's complaints, assessments, lab & U/S results, recommended tx plan 1) offer Expectant management, 2) Cytotec or 3) D&E with 2nd attending on 10/25 -- patient opted for expectant management  Results for orders placed or performed during the hospital encounter of 01/20/18 (from the past 24 hour(s))  Urinalysis, Routine w reflex microscopic     Status: Abnormal   Collection Time: 01/20/18  7:20 PM  Result Value Ref Range   Color, Urine STRAW (A) YELLOW   APPearance CLEAR CLEAR   Specific Gravity, Urine 1.003 (L) 1.005 - 1.030   pH 6.0 5.0 - 8.0   Glucose, UA 50 (A) NEGATIVE mg/dL   Hgb urine dipstick SMALL (A) NEGATIVE   Bilirubin Urine NEGATIVE NEGATIVE   Ketones, ur NEGATIVE NEGATIVE mg/dL   Protein, ur NEGATIVE NEGATIVE mg/dL   Nitrite NEGATIVE NEGATIVE   Leukocytes, UA NEGATIVE NEGATIVE   RBC / HPF 0-5 0 - 5 RBC/hpf   WBC, UA 0-5 0 - 5 WBC/hpf   Bacteria, UA RARE (A) NONE SEEN   Squamous Epithelial / LPF 0-5 0 - 5   [place] Mfm Ob Limited  Result Date: 01/21/2018 ----------------------------------------------------------------------  OBSTETRICS REPORT                       (Signed Final 01/21/2018 07:46 am) ---------------------------------------------------------------------- Patient Info  ID #:       [id number]                          D.O.B.:  1985-04-05 (32 yrs)  Name:       Marisa Fletcher                    Visit Date: 01/20/2018 08:01 pm  ---------------------------------------------------------------------- Performed By  Performed By:     Tomma Lightning             Ref. Address:     [address]                    [facility]  [address]                                                             [place], [place]                                                             [address]  Attending:        Noralee Space MD        Location:         [facility]  Referred By:      Raelyn Mora CNM ---------------------------------------------------------------------- Orders   #  Description                          Code         Ordered By   1  [place] MFM OB LIMITED                    [phone]     Sosie Gato  ----------------------------------------------------------------------   #  Order #                    Accession #                 Episode #   1  [id number]                  [id number]                  [id number]  ---------------------------------------------------------------------- Indications   Abdominal pain in pregnancy                    O99.89   [redacted] weeks gestation of pregnancy                Z3A.15   Vaginal bleeding in pregnancy, second          O46.92   trimester  ---------------------------------------------------------------------- Vital Signs  Weight (lb): 192                               Height:        5'4"  BMI:         32.95 ---------------------------------------------------------------------- Fetal Evaluation  Num Of Fetuses:         1  Preg. Location:         Intrauterine  Gest. Sac:              Intrauterine  Yolk Sac:               Not visualized  Fetal Pole:             Visualized  Cardiac Activity:       Absent  Amniotic Fluid  AFI FV:      Within normal limits ---------------------------------------------------------------------- Biometry  CRL:      34.3  mm     G.  Age:  10w 1d                  EDD:   08/17/18  ---------------------------------------------------------------------- OB History  Gravidity:    2          SAB:   1 ---------------------------------------------------------------------- Gestational Age  LMP:           15w 1d        Date:  10/06/17                 EDD:   07/13/18  Best:          15w 1d     Det. By:  LMP  (10/06/17)          EDD:   07/13/18 ---------------------------------------------------------------------- Cervix Uterus Adnexa  Cervix  Normal appearance by transabdominal scan. ---------------------------------------------------------------------- Impression  Patient was evaluated for abdominal pain and vaginal  bleeding.  A limited ultrasound study was performed. The CRL  measurement is consistent with 10w 1d gestation.  Unfortunately, no fetal heart activity is seen.  Impression: Fetal demise. ----------------------------------------------------------------------                  Noralee Space, MD Electronically Signed Final Report   01/21/2018 07:46 am ----------------------------------------------------------------------   Assessment and Plan  Vaginal bleeding in pregnancy, second trimester  - Bleeding precautions reviewed - Advised to return to MAU with bleeding heavy enough to soak a pad/hr - Advised that bleeding should be expected to get heavier as the SAB process continues  Abdominal pain during pregnancy in second trimester  - Advised to take Ibuprofen 600 mg every 6 hrs prn pain - Rx for Ultram 50 mg every 6 hrs prn moderate to severe pain -  IUFD at less than 20 weeks of gestation  - Information provided on SAB - Message sent to [id number] to reschedule patient's upcoming appt on Tuesday 01/25/18   - Discharge patient - Patient verbalized an understanding of the plan of care and agrees.    Raelyn Mora, MSN, CNM 01/20/2018, 7:44 PM

## 2018-01-25 ENCOUNTER — Ambulatory Visit: Payer: 59 | Admitting: Certified Nurse Midwife

## 2018-01-25 ENCOUNTER — Encounter: Payer: Self-pay | Admitting: *Deleted

## 2018-01-26 ENCOUNTER — Ambulatory Visit (INDEPENDENT_AMBULATORY_CARE_PROVIDER_SITE_OTHER): Payer: 59 | Admitting: Family Medicine

## 2018-01-26 ENCOUNTER — Encounter: Payer: Self-pay | Admitting: Family Medicine

## 2018-01-26 VITALS — BP 116/73 | HR 64 | Resp 16 | Ht 64.0 in | Wt 191.0 lb

## 2018-01-26 DIAGNOSIS — N96 Recurrent pregnancy loss: Secondary | ICD-10-CM | POA: Diagnosis not present

## 2018-01-26 NOTE — Assessment & Plan Note (Signed)
Unclear etiology, recent nml A1C, check TFT's, Anti-cardiolipin Abs, sono-hyst. Send tissue for Anora testing. Consider parental karaotype.

## 2018-01-26 NOTE — Progress Notes (Signed)
   Subjective:    Patient ID: Marisa Fletcher is a 32 y.o. female presenting with Follow-up (SAB)  on 01/26/2018  HPI: Patient with 10 wk IUFD with SAB last Friday. They saw fetus and put it in the fridge. This is her second miscarriage. Reports sister with cervical issue. They would like further information about why they miscarried.  Review of Systems  Constitutional: Negative for chills and fever.  Respiratory: Negative for shortness of breath.   Cardiovascular: Negative for chest pain.  Gastrointestinal: Negative for abdominal pain, nausea and vomiting.  Genitourinary: Negative for dysuria.  Skin: Negative for rash.      Objective:    BP 116/73   Pulse 64   Resp 16   Ht 5\' 4"  (1.626 m)   Wt 191 lb (86.6 kg)   BMI 32.79 kg/m  Physical Exam  Constitutional: She is oriented to person, place, and time. She appears well-developed and well-nourished. No distress.  HENT:  Head: Normocephalic and atraumatic.  Eyes: No scleral icterus.  Neck: Neck supple.  Cardiovascular: Normal rate.  Pulmonary/Chest: Effort normal.  Abdominal: Soft.  Neurological: She is alert and oriented to person, place, and time.  Skin: Skin is warm and dry.  Psychiatric: She has a normal mood and affect.        Assessment & Plan:   Problem List Items Addressed This Visit      Unprioritized   Recurrent pregnancy loss - Primary    Unclear etiology, recent nml A1C, check TFT's, Anti-cardiolipin Abs, sono-hyst. Send tissue for Anora testing. Consider parental karaotype.       Relevant Orders   Cardiolipin antibodies, IgM+IgG   TSH   [place] Sonohysterogram   B-HCG Quant      Total face-to-face time with patient: 15 minutes. Over 50% of encounter was spent on counseling and coordination of care. Return in about 4 weeks (around 02/23/2018).  Reva Bores 01/26/2018 4:40 PM

## 2018-01-26 NOTE — Patient Instructions (Signed)
Recurrent Pregnancy Loss Recurrent pregnancy loss is the loss of three or more pregnancies before 20 weeks of gestation. Losing three or more pregnancies in a row is rare. What are the causes? The most common cause of recurrent pregnancy loss is an abnormal number of chromosomes in the developing baby (fetus). Chromosomes are the structures inside cells that hold all your genetic material. In most cases of recurrent pregnancy loss, a missing or extra chromosome keeps a baby from developing. It may not be possible to identify which chromosome is defective. Chromosome abnormalities can be inherited, but most of the time they occur by chance. Other possible causes of recurrent pregnancy loss include:  Being born with an abnormal womb structure (septate uterus).  Having noncancerous growths in your uterus (fibroids or polyps).  Having a disease that causes scarring in your uterus (Asherman syndrome).  Having a disease that causes your blood to clot (antiphospholipid syndrome).  Having a disease that increases bleeding (thrombophilia).  What increases the risk? The risk of recurrent pregnancy loss increases as your age increases. Other risk factors include:  Diabetes.  Thyroid disease.  Obesity.  Smoking.  Excessive alcohol or caffeine.  Recreational drug use.  What are the signs or symptoms?  Vaginal bleeding.  Passing clots vaginally.  Abdominal pain or cramps.  Low back pain. How is this diagnosed? Your health care provider can create an image of your womb using sound waves and a computer (ultrasound) to confirm your pregnancy by 5 or 6 weeks after you conceive. Ultrasound can also confirm a pregnancy loss. Your health care provider may do a complete physical exam, including your vagina and uterus (pelvic exam), to find possible causes of recurrent pregnancy loss. Other tests may include:  Ultrasound imaging to see if the structure of your uterus is normal or if you have  polyps or fibroids.  Blood tests to see if you have a disease that causes recurrent pregnancy loss.  Blood tests to see if your blood clots normally.  Genetic testing of you and your partner.  How is this treated? In many cases, there is no specific treatment. Depending on the cause, possible treatments include:  Having your eggs fertilized outside your uterus (in vitro fertilization). By doing this, a health care provider may be able to select eggs without chromosome abnormalities.  Taking a blood thinner to prevent clotting if you have antiphospholipid syndrome.  Having corrective surgery if you have an abnormality in your uterus.  Follow these instructions at home: Follow all your health care provider's home instructions carefully. These may include:  Do not smoke or use recreational drugs.  Do not drink alcohol if you are pregnant.  Do not put anything in your vagina or have sex for 2 weeks after you have had a miscarriage.  If you are Rh negative and have had a miscarriage, you may need to get a shot of Rho (D) immune globulin. Ask your doctor if you need this shot.  Use birth control if you do not want to get pregnant. You can get pregnant [redacted] weeks after a miscarriage.  Get support from friends and loved ones. Miscarriage can be a sad and stressful event.  Contact a health care provider if:  You have light vaginal bleeding or spotting while pregnant.  You have been trying to get pregnant without success.  You are struggling with sadness or depression after a miscarriage. Get help right away if:  You have heavy vaginal bleeding and abdominal cramps.  You   have fever, chills, and severe abdominal pain. This information is not intended to replace advice given to you by your health care provider. Make sure you discuss any questions you have with your health care provider. Document Released: 09/02/2007 Document Revised: 08/22/2015 Document Reviewed: 01/06/2013 Elsevier  Interactive Patient Education  2018 Elsevier Inc.  

## 2018-01-27 LAB — CARDIOLIPIN ANTIBODIES, IGM+IGG
Anticardiolipin IgG: <14 [GPL'U]
Anticardiolipin IgM: 12 [MPL'U]

## 2018-01-27 LAB — TSH: TSH: 0.55 mIU/L

## 2018-01-27 LAB — HCG, QUANTITATIVE, PREGNANCY: HCG, Total, QN: 53 m[IU]/mL

## 2018-02-01 ENCOUNTER — Telehealth: Payer: Self-pay | Admitting: *Deleted

## 2018-02-01 ENCOUNTER — Other Ambulatory Visit: Payer: Self-pay | Admitting: Family Medicine

## 2018-02-01 ENCOUNTER — Encounter: Payer: Self-pay | Admitting: *Deleted

## 2018-02-01 ENCOUNTER — Ambulatory Visit (HOSPITAL_COMMUNITY)
Admission: RE | Admit: 2018-02-01 | Discharge: 2018-02-01 | Disposition: A | Discharge status: Home / Self Care | Payer: 59 | Source: Ambulatory Visit | Attending: Family Medicine | Admitting: Family Medicine

## 2018-02-01 DIAGNOSIS — N96 Recurrent pregnancy loss: Secondary | ICD-10-CM

## 2018-02-01 MED ORDER — MISOPROSTOL 200 MCG PO TABS: ORAL_TABLET | ORAL | 0 refills | Status: DC

## 2018-02-01 NOTE — Telephone Encounter (Signed)
Pt notified that the TVU showed that there was still some POC in the uterus.  Per Dr Shawnie Pons, pt can have Cytotec 1000 mcg to be used vaginally x1.  Once she has a period she will call to schedule U/S/HSG.  She has found out that her sister has a septated uterus

## 2018-02-01 NOTE — Telephone Encounter (Signed)
-----   Message from Reva Bores, MD sent at 02/01/2018 10:42 AM EST ----- Her sonohyst was scheduled too soon. She has some products still in the uterus which we knew.. I think she could use a bit of Cytotec--can send in 1000 mcg to pharmacy to be placed vaginally x 1. The ultrasound can be done following her next cycle though radiology stated HSG would be a better choice--would need to be ordered--so that is scheduled 5 days following next cycle. Thank you!

## 2018-02-04 ENCOUNTER — Telehealth: Payer: Self-pay

## 2018-02-04 DIAGNOSIS — O039 Complete or unspecified spontaneous abortion without complication: Secondary | ICD-10-CM

## 2018-02-04 MED ORDER — MISOPROSTOL 200 MCG PO TABS: ORAL_TABLET | ORAL | 0 refills | Status: DC

## 2018-02-04 NOTE — Telephone Encounter (Signed)
Pt was seen in our office on 01/26/18 by Dr.Pratt for f/u from MAU for SAB. Pratt sent in Cytotec for pt and pt took it 11/5. Pt sent MyChart message on 11/7 stating she still had not passed anything, was cramping and only seeing blood when she wiped. Luna Kitchens, CNM was our provider in office today and she contacted Dr.Pratt about this. Dr.Pratt said to give her a refill on the Cytotec and to tell the pt if she does not pass anything in the next 48 hours to call the office and be seen Monday morning. I talked to pt and she is aware of this plan. Pt states she will pick up Rx today and take it and will call the office to come in Monday if needed.

## 2018-02-07 ENCOUNTER — Ambulatory Visit (INDEPENDENT_AMBULATORY_CARE_PROVIDER_SITE_OTHER): Payer: 59 | Admitting: Obstetrics & Gynecology

## 2018-02-07 ENCOUNTER — Encounter: Payer: Self-pay | Admitting: Obstetrics & Gynecology

## 2018-02-07 VITALS — BP 130/73 | HR 69 | Resp 16 | Ht 64.0 in | Wt 191.0 lb

## 2018-02-07 DIAGNOSIS — O039 Complete or unspecified spontaneous abortion without complication: Secondary | ICD-10-CM

## 2018-02-07 NOTE — Progress Notes (Signed)
Bedside U/S shows 1.58mm endometrium

## 2018-02-07 NOTE — Progress Notes (Signed)
   Subjective:    Patient ID: Marisa Fletcher, female    DOB: Aug 25, 1985, 32 y.o.   MRN: [id number]  HPI  Pt here for f/u US.  Patient had some bleeding after the second dose of Cytotec.  Pt tried to conceive about 4-5 months before conceiving.  Pt had 10 week miscarriage which came back T21.  Pt also had 5 week miscarriage back in 2016.  She went to a non [facility] facility for follow up.  She is wondering whether she should proceed with the HSG.  Given that she has a genetic reason for the second miscarriage, it is unclear whether an HSG would be fruitful.  She does not have problems conceiving.  Genetic counseling is certainly warranted.   Review of Systems  Constitutional: Negative.   Respiratory: Negative.   Cardiovascular: Negative.   Gastrointestinal: Negative.   Genitourinary: Negative.   Musculoskeletal: Negative.   Psychiatric/Behavioral: Negative.        P Eiad atient has appropriate affect given recent miscarriage peer      Objective:   Physical Exam  Constitutional: She is oriented to person, place, and time. She appears well-developed and well-nourished. No distress.  HENT:  Head: Normocephalic and atraumatic.  Eyes: Conjunctivae are normal.  Cardiovascular: Normal rate.  Pulmonary/Chest: Effort normal.  Musculoskeletal: She exhibits no edema.  Neurological: She is alert and oriented to person, place, and time.  Skin: Skin is warm and dry.  Psychiatric: She has a normal mood and affect.  Vitals reviewed.  Vitals:   02/07/18 0955  BP: 130/73  Pulse: 69  Resp: 16  Weight: 191 lb (86.6 kg)  Height: 5\' 4"  (1.626 m)      Assessment & Plan:  32 year old female status post spontaneous miscarriage and 2 doses of Cytotec for retained POC.  1.  T 21 on pathology.  Patient to have genetics consult. 2.  Labs with Dr. Ty Hilts at last visit are normal. 3.  Continue prenatal vitamins.  I would suggest having 1 cycle before conception to be tried again. 4.  Given T 21+  pathology, structural abnormality exploration of uterus is not warranted at this time.  Patient can proceed with testing if they would like since have having had a miscarriage, many couples would like to do everything possible to ensure that miscarriage does not occur again. 5.  Beta hCG quant today.  Bedside US reveals lining thin.    25 minutes spent face-to-face with patient with greater than 50% counseling.

## 2018-02-08 LAB — HCG, QUANTITATIVE, PREGNANCY: HCG, Total, QN: 4 m[IU]/mL

## 2018-02-10 ENCOUNTER — Telehealth: Payer: Self-pay

## 2018-02-10 NOTE — Telephone Encounter (Addendum)
Left message on pt's voicemail letting her know negative pregnancy test serum quant  ----- Message from Lesly DukesKelly H Leggett, MD sent at 02/10/2018 10:52 AM EST ----- Negative pregnancy test serum quant.

## 2018-02-23 ENCOUNTER — Ambulatory Visit: Payer: 59 | Admitting: Obstetrics & Gynecology

## 2018-04-30 ENCOUNTER — Encounter: Payer: Self-pay | Admitting: Family Medicine

## 2018-05-02 ENCOUNTER — Other Ambulatory Visit: Payer: Self-pay | Admitting: Family Medicine

## 2018-05-02 MED ORDER — FLUOXETINE HCL 10 MG PO TABS: 10.0000 mg | ORAL_TABLET | Freq: Every day | ORAL | 3 refills | Status: DC

## 2018-08-08 ENCOUNTER — Telehealth: Payer: Self-pay | Admitting: *Deleted

## 2018-08-08 NOTE — Telephone Encounter (Signed)
Left patient a message to call and answer screening questions prior to her appointment or answer in MyChart message. Patient was also informed about no visitors and masking policies.

## 2018-08-09 ENCOUNTER — Other Ambulatory Visit (INDEPENDENT_AMBULATORY_CARE_PROVIDER_SITE_OTHER): Payer: 59

## 2018-08-09 ENCOUNTER — Other Ambulatory Visit: Payer: Self-pay

## 2018-08-09 DIAGNOSIS — Z3200 Encounter for pregnancy test, result unknown: Secondary | ICD-10-CM | POA: Diagnosis not present

## 2018-08-09 NOTE — Progress Notes (Signed)
Pt here for lab work only

## 2018-08-10 LAB — HCG, QUANTITATIVE, PREGNANCY: HCG, Total, QN: 9148 m[IU]/mL

## 2018-08-11 ENCOUNTER — Other Ambulatory Visit (INDEPENDENT_AMBULATORY_CARE_PROVIDER_SITE_OTHER): Payer: 59

## 2018-08-11 ENCOUNTER — Other Ambulatory Visit: Payer: Self-pay

## 2018-08-11 DIAGNOSIS — Z3200 Encounter for pregnancy test, result unknown: Secondary | ICD-10-CM | POA: Diagnosis not present

## 2018-08-11 NOTE — Progress Notes (Signed)
Pt here for f/u Quant. Blood drawn.

## 2018-08-12 ENCOUNTER — Telehealth: Payer: Self-pay | Admitting: Obstetrics & Gynecology

## 2018-08-12 ENCOUNTER — Ambulatory Visit (INDEPENDENT_AMBULATORY_CARE_PROVIDER_SITE_OTHER): Payer: 59

## 2018-08-12 ENCOUNTER — Other Ambulatory Visit: Payer: Self-pay | Admitting: Obstetrics & Gynecology

## 2018-08-12 ENCOUNTER — Other Ambulatory Visit: Payer: Self-pay

## 2018-08-12 ENCOUNTER — Telehealth: Payer: Self-pay

## 2018-08-12 DIAGNOSIS — O3680X0 Pregnancy with inconclusive fetal viability, not applicable or unspecified: Secondary | ICD-10-CM

## 2018-08-12 LAB — HCG, QUANTITATIVE, PREGNANCY: HCG, Total, QN: 12127 m[IU]/mL

## 2018-08-12 NOTE — Telephone Encounter (Signed)
Faculty Practice OB/GYN Physician Phone Call Documentation  I received a call from [facility], trying to get results of ultrasound done earlier today for abnormally rising HCG values (9K to 12K in 48 hours). She has no bleeding or pain, but was nervous about her ultrasound.  US Ob Less Than 14 Weeks With Ob Transvaginal  Result Date: 08/12/2018 CLINICAL DATA:  Abnormal beta HCG values for expected gestational age EXAM: OBSTETRIC <14 WK [place] AND TRANSVAGINAL OB US TECHNIQUE: Both transabdominal and transvaginal ultrasound examinations were performed for complete evaluation of the gestation as well as the maternal uterus, adnexal regions, and pelvic cul-de-sac. Transvaginal technique was performed to assess early pregnancy. COMPARISON:  None. FINDINGS: Intrauterine gestational sac: Visualized Yolk sac:  Visualized Embryo:  Visualized Cardiac Activity: Not visualized CRL:  7 mm   6 w   4 d Subchorionic hemorrhage: There is a 1.4 x 1.1 cm subchorionic hemorrhage. Maternal uterus/adnexae: Left ovary could not be seen by either transabdominal transvaginal technique. No left-sided pelvic mass evident. The right ovary measures 2.1 x 2.0 x 1.6 cm. There is a 1.2 cm corpus luteum. No free pelvic fluid. IMPRESSION: 1. There is a 7 mm fetal pole without evidence of fetal heart activity. Findings meet definitive criteria for failed pregnancy. This follows SRU consensus guidelines: Diagnostic Criteria for Nonviable Pregnancy Early in the First Trimester. Macy Mis J Med [phone]. 2.  Small subchorionic hemorrhage measuring 1.4 x 1.1 cm. These results will be called to the ordering clinician or representative by the Radiologist Assistant, and communication documented in the PACS or zVision Dashboard. Electronically Signed   By: Bretta Bang III M.D.   On: 08/12/2018 14:32    Results were discussed with patient, patient was told this does meet criteria for a failed pregnancy.  This is a desired pregnancy, and she  has a history of two previous miscarriages.  Patient was given the option of repeat ultrasound if desired (but emphasized that I was not giving her false hope given HCG and ultrasound findings, but she may desire this prior to definitive management).  For management, discussed expectant management vs misoprostol vs D&E.  She was crying, and wants to consider her options over the weekend and come in to [place] office to discuss this on Monday.  Will forward this note to [facility] team so she can be worked in on the schedule on Monday 08/15/18, will discuss evaluation of recurrent miscarriages at that time.  Appropriate support given to her.    Jaynie Collins, MD, FACOG Obstetrician & Gynecologist, [facility] for [employer], [facility]

## 2018-08-12 NOTE — Progress Notes (Signed)
U/S order placed per Dr.Dove due to abnormal B-HCG quant levels

## 2018-08-12 NOTE — Telephone Encounter (Addendum)
Spoke with pt to let her know that Dr.Dove has placed an order for an U/S today due to abnormal B-HCG Quant levels. U/S is scheduled for 1:15 today in imaging dept. Pt is aware. Pt is also aware to disregard my previous MyChart message from earlier today.   U/S dept is aware to call [phone] to speak with the provider on call with results per Dr.Dove.

## 2018-08-12 NOTE — Progress Notes (Signed)
Had to fix previous order placed for U/S per Dr.Dove. Order fixed and pt scheduled for U/S

## 2018-08-15 ENCOUNTER — Encounter: Payer: Self-pay | Admitting: Obstetrics & Gynecology

## 2018-08-15 ENCOUNTER — Ambulatory Visit (INDEPENDENT_AMBULATORY_CARE_PROVIDER_SITE_OTHER): Payer: 59 | Admitting: Obstetrics & Gynecology

## 2018-08-15 ENCOUNTER — Other Ambulatory Visit: Payer: Self-pay

## 2018-08-15 DIAGNOSIS — D509 Iron deficiency anemia, unspecified: Secondary | ICD-10-CM

## 2018-08-15 DIAGNOSIS — N96 Recurrent pregnancy loss: Secondary | ICD-10-CM

## 2018-08-15 MED ORDER — MISOPROSTOL 200 MCG PO TABS: ORAL_TABLET | ORAL | 1 refills | Status: DC

## 2018-08-15 MED ORDER — OXYCODONE-ACETAMINOPHEN 5-325 MG PO TABS: 1.0000 | ORAL_TABLET | Freq: Four times a day (QID) | ORAL | 0 refills | Status: DC | PRN

## 2018-08-15 NOTE — Progress Notes (Signed)
TELEHEALTH GYNECOLOGY VIRTUAL VIDEO VISIT ENCOUNTER NOTE  Provider location: [facility] for [employer] at [place]   I connected with Marisa Fletcher on 08/15/18 at  1:45 PM EDT by WebEx Video Encounter at home and verified that I am speaking with the correct person using two identifiers.   I discussed the limitations, risks, security and privacy concerns of performing an evaluation and management service by telephone and the availability of in person appointments. I also discussed with the patient that there may be a patient responsible charge related to this service. The patient expressed understanding and agreed to proceed.     History:  Marisa Fletcher is a 33 y.o. G4P0020 female being evaluated today for miscarriage daignosed on [place] last week. She denies any abnormal vaginal discharge, bleeding, pelvic pain or other concerns.  Pt has had 2 prior miscarriages and had a small workup by Dr. Shawnie Pons after last miscarriage.       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    Review of Systems:  Pertinent items noted in HPI and remainder of comprehensive ROS otherwise negative.  Physical Exam:   General:  Alert, oriented and cooperative. Patient appears to be in no acute distress.  Mental Status: Normal mood and affect. Normal behavior. Normal  judgment and thought content.   Respiratory: Normal respiratory effort, no problems with respiration noted  Rest of physical exam deferred due to type of encounter  Labs and Imaging Results for orders placed or performed in visit on 08/11/18 (from the past 336 hour(s))  B-HCG Quant   Collection Time: 08/11/18  8:14 AM  Result Value Ref Range   HCG, Total, QN 12,127 mIU/mL  Results for orders placed or performed in visit on 08/09/18 (from the past 336 hour(s))  B-HCG Quant   Collection Time: 08/09/18  8:22 AM  Result Value Ref Range   HCG, Total, QN 9,148 mIU/mL   US Ob Less Than 14 Weeks With Ob Transvaginal  Result Date: 08/12/2018 CLINICAL DATA:  Abnormal beta HCG values for expected gestational age EXAM: OBSTETRIC <14 WK [place] AND TRANSVAGINAL OB US TECHNIQUE: Both transabdominal and transvaginal ultrasound examinations were performed for complete evaluation of the gestation as well as the maternal uterus, adnexal regions, and pelvic cul-de-sac. Transvaginal technique was performed to assess early pregnancy. COMPARISON:  None. FINDINGS: Intrauterine gestational sac: Visualized Yolk sac:  Visualized Embryo:  Visualized Cardiac Activity: Not visualized CRL:  7 mm   6 w   4 d Subchorionic hemorrhage: There is a 1.4 x 1.1 cm subchorionic hemorrhage. Maternal uterus/adnexae: Left ovary could not be seen by either transabdominal transvaginal technique. No left-sided pelvic mass evident. The right ovary measures 2.1 x 2.0 x 1.6 cm. There is a 1.2 cm corpus luteum. No free pelvic fluid. IMPRESSION: 1. There  is a 7 mm fetal pole without evidence of fetal heart activity. Findings meet definitive criteria for failed pregnancy. This follows SRU consensus guidelines: Diagnostic Criteria for Nonviable Pregnancy Early in the First Trimester. Macy Mis Engl J Med [phone]. 2.  Small subchorionic hemorrhage measuring 1.4 x 1.1 cm. These results will be called to the ordering clinician or representative by the  Radiologist Assistant, and communication documented in the PACS or zVision Dashboard. Electronically Signed   By: Bretta BangWilliam  Woodruff III M.D.   On: 08/12/2018 14:32       Assessment and Plan:     1. Iron deficiency anemia, unspecified iron deficiency anemia type - CBC  2.  Missed Ab -pt agrees for cytotec.  Will check cbc before administration.  Pt is Rh positive.  Pain med Rx given.  3.  Recurrent miscarriages -refer to Dr. Jeannie FendY for evaluation        I discussed the assessment and treatment plan with the patient. The patient was provided an opportunity to ask questions and all were answered. The patient agreed with the plan and demonstrated an understanding of the instructions.   The patient was advised to call back or seek an in-person evaluation/go to the ED if the symptoms worsen or if the condition fails to improve as anticipated.  I provided 25 minutes of face-to-face time during this encounter.   Elsie LincolnKelly Clifton Safley, MD [facility], [facility]

## 2018-08-16 DIAGNOSIS — N96 Recurrent pregnancy loss: Secondary | ICD-10-CM | POA: Insufficient documentation

## 2018-08-16 LAB — CBC
HCT: 36.4 % (ref 35.0–45.0)
Hemoglobin: 12 g/dL (ref 11.7–15.5)
MCH: 26.5 pg — ABNORMAL LOW (ref 27.0–33.0)
MCHC: 33 g/dL (ref 32.0–36.0)
MCV: 80.5 fL (ref 80.0–100.0)
MPV: 9.8 fL (ref 7.5–12.5)
Platelets: 324 10*3/uL (ref 140–400)
RBC: 4.52 10*6/uL (ref 3.80–5.10)
RDW: 15.6 % — ABNORMAL HIGH (ref 11.0–15.0)
WBC: 6.5 10*3/uL (ref 3.8–10.8)

## 2018-08-18 ENCOUNTER — Other Ambulatory Visit: Payer: Self-pay | Admitting: Obstetrics & Gynecology

## 2018-08-18 MED ORDER — OXYCODONE-ACETAMINOPHEN 5-325 MG PO TABS: 1.0000 | ORAL_TABLET | Freq: Four times a day (QID) | ORAL | 0 refills | Status: DC | PRN

## 2018-08-18 NOTE — Progress Notes (Signed)
Pt having pain after cytotec and has taken the 5 percocet prescribed.  5 more percocet prescribed.

## 2018-09-07 ENCOUNTER — Encounter: Payer: 59 | Admitting: Obstetrics and Gynecology

## 2018-11-27 ENCOUNTER — Other Ambulatory Visit: Payer: Self-pay | Admitting: Family Medicine

## 2019-01-20 ENCOUNTER — Other Ambulatory Visit: Payer: Self-pay | Admitting: Family Medicine

## 2019-02-28 ENCOUNTER — Encounter: Payer: Self-pay | Admitting: Family Medicine

## 2019-02-28 ENCOUNTER — Other Ambulatory Visit: Payer: Self-pay | Admitting: Family Medicine

## 2019-02-28 DIAGNOSIS — R05 Cough: Secondary | ICD-10-CM

## 2019-02-28 DIAGNOSIS — R059 Cough, unspecified: Secondary | ICD-10-CM

## 2019-02-28 MED ORDER — AZITHROMYCIN 250 MG PO TABS: ORAL_TABLET | ORAL | 0 refills | Status: DC

## 2019-02-28 MED ORDER — METHYLPREDNISOLONE 4 MG PO TABS: ORAL_TABLET | ORAL | 0 refills | Status: DC

## 2019-02-28 MED ORDER — ALBUTEROL SULFATE HFA 108 (90 BASE) MCG/ACT IN AERS: 2.0000 | INHALATION_SPRAY | Freq: Four times a day (QID) | RESPIRATORY_TRACT | 1 refills | Status: DC | PRN

## 2019-02-28 NOTE — Telephone Encounter (Signed)
Please advise 

## 2019-03-03 ENCOUNTER — Other Ambulatory Visit: Payer: Self-pay | Admitting: Family Medicine

## 2019-04-03 ENCOUNTER — Encounter: Payer: Self-pay | Admitting: Family Medicine

## 2019-04-06 ENCOUNTER — Telehealth: Payer: Self-pay | Admitting: Family Medicine

## 2019-04-06 NOTE — Telephone Encounter (Signed)
Called patient left message for patient to call the office back. Per Dr. Abner Greenspan she needs to see her today for a virtual visit

## 2019-04-06 NOTE — Telephone Encounter (Signed)
Patient is calling to schedule virtual vist per Dr. Abner Greenspan. Nothing available on schedule until 05/15/19. Please advise [phone]

## 2019-04-07 NOTE — Telephone Encounter (Signed)
LM for pt to call and schedule VV with Dr. Abner Greenspan. Offered 04/11/2019 if still available.

## 2019-04-20 ENCOUNTER — Encounter: Payer: Self-pay | Admitting: Family Medicine

## 2019-06-22 ENCOUNTER — Encounter: Payer: Self-pay | Admitting: Family Medicine

## 2019-06-23 ENCOUNTER — Ambulatory Visit (INDEPENDENT_AMBULATORY_CARE_PROVIDER_SITE_OTHER): Payer: Self-pay | Admitting: Family Medicine

## 2019-06-23 ENCOUNTER — Other Ambulatory Visit: Payer: Self-pay

## 2019-06-23 ENCOUNTER — Encounter: Payer: Self-pay | Admitting: Family Medicine

## 2019-06-23 DIAGNOSIS — N96 Recurrent pregnancy loss: Secondary | ICD-10-CM

## 2019-06-23 DIAGNOSIS — F418 Other specified anxiety disorders: Secondary | ICD-10-CM

## 2019-06-23 MED ORDER — VENLAFAXINE HCL ER 37.5 MG PO CP24: 37.5000 mg | ORAL_CAPSULE | Freq: Every day | ORAL | 0 refills | Status: DC

## 2019-06-23 MED ORDER — VENLAFAXINE HCL ER 75 MG PO CP24: 75.0000 mg | ORAL_CAPSULE | Freq: Every day | ORAL | 2 refills | Status: DC

## 2019-06-23 MED ORDER — CLONAZEPAM 0.5 MG PO TABS: 0.2500 mg | ORAL_TABLET | Freq: Two times a day (BID) | ORAL | 1 refills | Status: DC | PRN

## 2019-06-23 NOTE — Progress Notes (Signed)
Wants to try a new depression and anxiety meds. Stopped current regimen in January (prozc, and klonopin)  Wants to know should she take the covid vaccine bc she has so many allergies and has had an allergic reaction to the flu shot

## 2019-06-23 NOTE — Patient Instructions (Signed)
Omron Blood Pressure cuff, upper arm, want BP 100-140/60-90 Pulse oximeter, want oxygen in 90s  Weekly vitals  Take Multivitamin with minerals, selenium Vitamin D 1000-2000 IU daily Probiotic with lactobacillus and bifidophilus Asprin EC 81 mg daily  Melatonin 2-5 mg at bedtime  [email or web address]  The mRNA technology has been in development for 20 years and we already had the Coronavirus family of viruses (which usually just cause the common cold) genetically mapped already which is why we were able to come up with viable vaccine candidates so quickly in stage 1, then stage 2 scientifically took the correct amount of time what we did to speed it up was just build the manufacturing platform at the same time we were running the experiments so if it worked we could produce faster. And stage 3 has now had many months and millions of people immunized and we are seeing the immunity hold for over 9 months now with sign of it dissipating and no significant numbers of adverse reactions.  During every flu season we see 2 anaphylactic reactions for every million shots given and we initially thought we would see 11 per million with the COVID vaccine but now we see only 2-3 with Moderna and 5 or so with Pfizer so compared to someone is dying every 20 minutes from COVID and more deadly and infectious strains are coming it is definitely best when weighing the risks and benefits to take the shots.  Another pooled analysis of the 5 most utilized vaccines in the world shows that after full immunization so far no one has died from COVID.  

## 2019-06-23 NOTE — Assessment & Plan Note (Addendum)
Stopped the prozac as she felt it was not helping enough. Will try Venlafaxine 37.5 mg daily x 7 days then increase to 150 mg daily and is allowed some Clonopin to use prn. Discussed adding therapy but she is without insurance coverage at present so will hold off for now.

## 2019-06-25 NOTE — Assessment & Plan Note (Addendum)
Is taking a break from trying due to her difficulties and agrees to stop meds if pregnancy becomes an option

## 2019-06-25 NOTE — Progress Notes (Signed)
Patient ID: Marisa Fletcher, female   DOB: [birth year], 34 y.o.   MRN: [id number] Virtual Visit via Video Note  I connected with Marisa Fletcher on 06/23/19 at  9:40 AM EDT by a video enabled telemedicine application and verified that I am speaking with the correct person using two identifiers.  Location: Patient: home Provider: home   I discussed the limitations of evaluation and management by telemedicine and the availability of in person appointments. The patient expressed understanding and agreed to proceed. Crissie Sickles, CMA was able to set the patient up on a video visit.    Subjective:    Patient ID: Marisa Fletcher, female    DOB: 02-22-1986, 34 y.o.   MRN: [id number]  Chief Complaint  Patient presents with  . Follow-up    Wants to try new depression and anxiety med     HPI Patient is in today for evaluation of recurrent depression and anxiety. She endorses anxiety and depression complicated by several pregnancy losses. They are not currently attempting to get pregnant. No suicidal ideation but does endorse anhedonia. Denies CP/palp/SOB/HA/congestion/fevers/GI or GU c/o. Taking meds as prescribed  Past Medical History:  Diagnosis Date  . Allergy   . 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 Asthma   . Cholelithiasis   . Depression   . Depression with anxiety 04/17/2010   Qualifier: Diagnosis of  By: Abner Greenspan MD, Misty Stanley    . 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 Pancreatitis   . Preventative health care 07/09/2012    Past Surgical History:  Procedure Laterality Date  . CHOLECYSTECTOMY  2007   with pancreatitis  . DILATION AND CURETTAGE OF UTERUS    . wisdom teeth extracted      Family History  Problem Relation Age of Onset  . Cholelithiasis Father   . Allergies Father   . Asthma Father   . Anxiety disorder Sister   . ADD / ADHD Brother        ADD  . Memory loss Maternal Grandmother   . Depression Maternal Grandmother        possible bipolar  . Obesity Maternal Grandmother   . Alcohol abuse  Maternal Grandfather   . Cancer Paternal Grandmother   . Heart disease Paternal Grandmother   . Asthma Paternal Grandfather   . Allergies Paternal Grandfather   . Cholelithiasis Paternal Grandfather   . ADD / ADHD Sister        ADD  . Other Mother        Back disease    Social History   Socioeconomic History  . Marital status: Married    Spouse name: Not on file  . Number of children: Not on file  . Years of education: Not on file  . Highest education level: Not on file  Occupational History  . Occupation: [employer]  Tobacco Use  . Smoking status: Former Smoker    Packs/day: 0.10    Quit date: 12/28/2009    Years since quitting: 9.4  . Smokeless tobacco: Never Used  Substance and Sexual Activity  . Alcohol use: Not Currently    Alcohol/week: 0.0 standard drinks  . Drug use: No  . Sexual activity: Yes    Birth control/protection: None  Other Topics Concern  . Not on file  Social History Narrative  . Not on file   Social Determinants of Health   Financial Resource Strain:   . Difficulty of Paying Living Expenses:   Food Insecurity:   . Worried About Programme researcher, broadcasting/film/video in  the Last Year:   . [place] in the Last Year:   Transportation Needs:   . Film/video editor (Medical):   Marland Kitchen Lack of Transportation (Non-Medical):   Physical Activity:   . Days of Exercise per Week:   . Minutes of Exercise per Session:   Stress:   . Feeling of Stress :   Social Connections:   . Frequency of Communication with Friends and Family:   . Frequency of Social Gatherings with Friends and Family:   . Attends Religious Services:   . Active Member of Clubs or Organizations:   . Attends Archivist Meetings:   Marland Kitchen Marital Status:   Intimate Partner Violence:   . Fear of Current or Ex-Partner:   . Emotionally Abused:   Marland Kitchen Physically Abused:   . Sexually Abused:     Outpatient Medications Prior to Visit  Medication Sig Dispense Refill  . albuterol (VENTOLIN HFA)  108 (90 Base) MCG/ACT inhaler Inhale 2 puffs into the lungs every 6 (six) hours as needed for wheezing or shortness of breath. 18 g 1  . azithromycin (ZITHROMAX) 250 MG tablet 2 tabs po once then 1 tab po daily x 4 days 6 tablet 0  . clonazePAM (KLONOPIN) 0.5 MG tablet Take 0.5 mg by mouth 2 (two) times daily as needed for anxiety.    Marland Kitchen FLUoxetine (PROZAC) 10 MG tablet Take 1 tablet by mouth once daily 30 tablet 0  . methylPREDNISolone (MEDROL) 4 MG tablet 5 tab po qd X 1d then 4 tab po qd X 1d then 3 tab po qd X 1d then 2 tab po qd then 1 tab po qd 15 tablet 0  . misoprostol (CYTOTEC) 200 MCG tablet Place 5 tablets into vaginal x 1 (Patient not taking: Reported on 02/07/2018) 5 tablet 0  . misoprostol (CYTOTEC) 200 MCG tablet Insert four tablets vaginally the night prior to your appointment 4 tablet 1  . oxyCODONE-acetaminophen (PERCOCET/ROXICET) 5-325 MG tablet Take 1 tablet by mouth every 6 (six) hours as needed for up to 5 doses. 5 tablet 0  . traMADol (ULTRAM) 50 MG tablet Take 1 tablet (50 mg total) by mouth every 6 (six) hours as needed. (Patient not taking: Reported on 02/07/2018) 15 tablet 0   No facility-administered medications prior to visit.    Allergies  Allergen Reactions  . Eggs Or Egg-Derived Products Swelling    Swelling in throat  . Peanut-Containing Drug Products Swelling    Throat swelling  . Beef-Derived Products   . Citalopram Nausea Only    insomnia  . Gluten Meal     ROS     Objective:    Physical Exam  Wt 200 lb (90.7 kg)   LMP 06/26/2018 (Exact Date)   BMI 34.33 kg/m  Wt Readings from Last 3 Encounters:  06/23/19 200 lb (90.7 kg)  02/07/18 191 lb (86.6 kg)  01/26/18 191 lb (86.6 kg)    Diabetic Foot Exam - Simple   No data filed     Lab Results  Component Value Date   WBC 6.5 08/15/2018   HGB 12.0 08/15/2018   HCT 36.4 08/15/2018   PLT 324 08/15/2018   GLUCOSE 84 09/02/2017   CHOL 127 07/04/2012   TRIG 55 07/04/2012   HDL 51 07/04/2012    LDLCALC 65 07/04/2012   ALT 16 09/02/2017   AST 18 09/02/2017   NA 137 09/02/2017   K 3.6 09/02/2017   CL 104 09/02/2017   CREATININE  0.73 09/02/2017   BUN 10 09/02/2017   CO2 23 09/02/2017   TSH 0.55 01/26/2018   HGBA1C 5.3 12/15/2017    Lab Results  Component Value Date   TSH 0.55 01/26/2018   Lab Results  Component Value Date   WBC 6.5 08/15/2018   HGB 12.0 08/15/2018   HCT 36.4 08/15/2018   MCV 80.5 08/15/2018   PLT 324 08/15/2018   Lab Results  Component Value Date   NA 137 09/02/2017   K 3.6 09/02/2017   CO2 23 09/02/2017   GLUCOSE 84 09/02/2017   BUN 10 09/02/2017   CREATININE 0.73 09/02/2017   BILITOT 0.3 09/02/2017   ALKPHOS 51 09/02/2017   AST 18 09/02/2017   ALT 16 09/02/2017   PROT 7.6 09/02/2017   ALBUMIN 4.4 09/02/2017   CALCIUM 9.2 09/02/2017   ANIONGAP 10 10/22/2013   GFR 98.89 09/02/2017   Lab Results  Component Value Date   CHOL 127 07/04/2012   Lab Results  Component Value Date   HDL 51 07/04/2012   Lab Results  Component Value Date   LDLCALC 65 07/04/2012   Lab Results  Component Value Date   TRIG 55 07/04/2012   Lab Results  Component Value Date   CHOLHDL 2.5 07/04/2012   Lab Results  Component Value Date   HGBA1C 5.3 12/15/2017       Assessment & Plan:   Problem List Items Addressed This Visit    Depression with anxiety    Stopped the prozac as she felt it was not helping enough. Will try Venlafaxine 37.5 mg daily x 7 days then increase to 150 mg daily and is allowed some Clonopin to use prn. Discussed adding therapy but she is without insurance coverage at present so will hold off for now.       Relevant Medications   venlafaxine XR (EFFEXOR XR) 37.5 MG 24 hr capsule   venlafaxine XR (EFFEXOR XR) 75 MG 24 hr capsule   Recurrent pregnancy loss    Is taking a break from trying due to her difficulties and agrees to stop meds if pregnancy becomes an option         I have discontinued Keshauna Kulaga "Shy  Commisso"'s traMADol, misoprostol, misoprostol, oxyCODONE-acetaminophen, methylPREDNISolone, azithromycin, and FLUoxetine. I have also changed her clonazePAM. Additionally, I am having her start on venlafaxine XR and venlafaxine XR. Lastly, I am having her maintain her albuterol.  Meds ordered this encounter  Medications  . venlafaxine XR (EFFEXOR XR) 37.5 MG 24 hr capsule    Sig: Take 1 capsule (37.5 mg total) by mouth daily with breakfast.    Dispense:  13 capsule    Refill:  0  . venlafaxine XR (EFFEXOR XR) 75 MG 24 hr capsule    Sig: Take 1 capsule (75 mg total) by mouth daily with breakfast.    Dispense:  30 capsule    Refill:  2  . clonazePAM (KLONOPIN) 0.5 MG tablet    Sig: Take 0.5-1 tablets (0.25-0.5 mg total) by mouth 2 (two) times daily as needed for anxiety.    Dispense:  30 tablet    Refill:  1     Danise Edge, MD    I discussed the assessment and treatment plan with the patient. The patient was provided an opportunity to ask questions and all were answered. The patient agreed with the plan and demonstrated an understanding of the instructions.   The patient was advised to call back or seek an in-person evaluation  if the symptoms worsen or if the condition fails to improve as anticipated.  I provided 15 minutes of non-face-to-face time during this encounter.   Danise Edge, MD

## 2019-09-03 ENCOUNTER — Encounter: Payer: Self-pay | Admitting: Family Medicine

## 2019-09-04 MED ORDER — VENLAFAXINE HCL ER 75 MG PO CP24: 75.0000 mg | ORAL_CAPSULE | Freq: Every day | ORAL | 2 refills | Status: DC

## 2019-09-15 ENCOUNTER — Ambulatory Visit: Payer: Self-pay | Admitting: Family Medicine

## 2020-01-10 ENCOUNTER — Other Ambulatory Visit: Payer: Self-pay | Admitting: Family Medicine

## 2020-01-10 NOTE — Telephone Encounter (Signed)
Requesting: klonopin  Contract: n/a UDS:n/a Last Visit:06/23/19 Next Visit:02/08/20 Last Refill:06/23/19  Please Advise

## 2020-02-08 ENCOUNTER — Ambulatory Visit: Payer: Self-pay | Admitting: Family Medicine

## 2020-02-13 ENCOUNTER — Other Ambulatory Visit: Payer: Self-pay | Admitting: Family Medicine

## 2020-02-16 ENCOUNTER — Other Ambulatory Visit: Payer: Self-pay | Admitting: Family Medicine

## 2020-02-16 NOTE — Telephone Encounter (Signed)
Can you verify what dosage of venlafaxine Pt should be on? Or is she on both?

## 2020-02-16 NOTE — Telephone Encounter (Signed)
Please check with patient and confirm which dose she is taking and refill. She will need an appt soon.

## 2020-06-03 ENCOUNTER — Other Ambulatory Visit: Payer: Self-pay | Admitting: Family Medicine

## 2020-06-04 NOTE — Telephone Encounter (Signed)
I have not seen her in nearly a year and she was only given 1 month of each strength so she must be out of it. Please confirm she wants to restart and set her up with an appt in the next month or two. Then can send in the #13 of the 37.5 and then she can increase to 75 mg daily after that.

## 2020-07-08 ENCOUNTER — Telehealth: Payer: Self-pay | Admitting: *Deleted

## 2020-07-08 MED ORDER — VENLAFAXINE HCL ER 75 MG PO CP24
75.0000 mg | ORAL_CAPSULE | Freq: Every day | ORAL | 0 refills | Status: DC
Start: 2020-07-08 — End: 2020-08-29

## 2020-07-08 MED ORDER — VENLAFAXINE HCL ER 75 MG PO CP24
75.0000 mg | ORAL_CAPSULE | Freq: Every day | ORAL | 0 refills | Status: DC
Start: 2020-07-08 — End: 2020-07-08

## 2020-07-08 NOTE — Telephone Encounter (Signed)
Hopkins, Hannah N  Chism, Shaakira A, CMA 13 days ago   [facility]  Please advise    Message text    [place], Melane "Marisa Fletcher"  Patient Appointment Schedule Request Pool 2 weeks ago      Appointment Request From: Marisa Fletcher  With Provider: Danise Edge, MD [facility]]  Preferred Date Range: 06/26/2020 - 09/26/2020  Preferred Times: Any Time  Reason for visit: Office Visit  Comments: I'm on my last month refill of my medication that says I need to see dr.blyth to refill. It's my depression/ anxiety meds so very important to me. I could a phone call or anything available, I just don't want to run out

## 2020-07-08 NOTE — Telephone Encounter (Signed)
rx sent in. Patient still needs to schedule appointment.  Will get patient scheduled

## 2020-08-29 ENCOUNTER — Ambulatory Visit (INDEPENDENT_AMBULATORY_CARE_PROVIDER_SITE_OTHER): Payer: BLUE CROSS/BLUE SHIELD | Admitting: Family Medicine

## 2020-08-29 ENCOUNTER — Encounter: Payer: Self-pay | Admitting: Family Medicine

## 2020-08-29 ENCOUNTER — Other Ambulatory Visit: Payer: Self-pay

## 2020-08-29 VITALS — BP 128/70 | HR 76 | Temp 98.3°F | Resp 18 | Ht 64.0 in | Wt 216.4 lb

## 2020-08-29 DIAGNOSIS — N96 Recurrent pregnancy loss: Secondary | ICD-10-CM

## 2020-08-29 DIAGNOSIS — F418 Other specified anxiety disorders: Secondary | ICD-10-CM | POA: Diagnosis not present

## 2020-08-29 MED ORDER — CLONAZEPAM 0.5 MG PO TABS
ORAL_TABLET | ORAL | 0 refills | Status: DC
Start: 2020-08-29 — End: 2022-08-28

## 2020-08-29 MED ORDER — VENLAFAXINE HCL ER 75 MG PO CP24
75.0000 mg | ORAL_CAPSULE | Freq: Every day | ORAL | 1 refills | Status: DC
Start: 2020-08-29 — End: 2021-04-14

## 2020-08-29 NOTE — Assessment & Plan Note (Signed)
Patient is doing well on Venlafaxine 75 mg daily and uses Clonazepam infrequently at this time may continue both meds

## 2020-08-29 NOTE — Assessment & Plan Note (Signed)
She is following with gynecology in [place] she is encouraged to have a preconception appointment with her obstetrician to discuss medications and next steps. Maintain a prenatal vitamin daily while waiting.

## 2020-08-29 NOTE — Progress Notes (Signed)
Patient ID: Marisa Fletcher, female    DOB: 1985/12/04  Age: 35 y.o. MRN: [id number]    Subjective:  Subjective  HPI Marisa Fletcher presents for office visit today for follow up on asthma and anxiety. She states that she is doing well and has no recent sicknesses or recent ER visits to report. She reports that she is feeling better on Venlafaxine XR 75 mg, but states that she sometimes experiences withdrawal symptoms when she accidentally skips a day. She states that her anxiety attacks have lessened significantly. She reports that she could not see a specialist for her anxiety due to her not having insurance, but recently she just got on one and plans on seeing a psychiatrist. She denies any chest pain, SOB, fever, abdominal pain, cough, chills, sore throat, dysuria, urinary incontinence, back pain, HA, or N/VD.    Review of Systems  Constitutional: Negative for chills, fatigue and fever.  HENT: Negative for congestion, rhinorrhea, sinus pressure, sinus pain and sore throat.   Eyes: Negative for pain.  Respiratory: Negative for cough and shortness of breath.   Cardiovascular: Negative for chest pain, palpitations and leg swelling.  Gastrointestinal: Negative for abdominal pain, blood in stool, diarrhea, nausea and vomiting.  Genitourinary: Negative for decreased urine volume, flank pain, frequency, vaginal bleeding and vaginal discharge.  Musculoskeletal: Negative for back pain.  Neurological: Negative for headaches.    History Past Medical History:  Diagnosis Date  . Allergy   . 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 Asthma   . Cholelithiasis   . Depression   . Depression with anxiety 04/17/2010   Qualifier: Diagnosis of  By: Abner Greenspan MD, Misty Stanley    . 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 Pancreatitis   . Preventative health care 07/09/2012    She has a past surgical history that includes Cholecystectomy (2007); wisdom teeth extracted; and Dilation and curettage of uterus.   Her family history includes ADD / ADHD  in her brother and sister; Alcohol abuse in her maternal grandfather; Allergies in her father and paternal grandfather; Anxiety disorder in her sister; Asthma in her father and paternal grandfather; Cancer in her paternal grandmother; Cholelithiasis in her father and paternal grandfather; Depression in her maternal grandmother; Heart disease in her paternal grandmother; Memory loss in her maternal grandmother; Obesity in her maternal grandmother; Other in her mother.She reports that she quit smoking about 10 years ago. She smoked 0.10 packs per day. She has never used smokeless tobacco. She reports previous alcohol use. She reports that she does not use drugs.  Current Outpatient Medications on File Prior to Visit  Medication Sig Dispense Refill  . albuterol (VENTOLIN HFA) 108 (90 Base) MCG/ACT inhaler Inhale 2 puffs into the lungs every 6 (six) hours as needed for wheezing or shortness of breath. 18 g 1   No current facility-administered medications on file prior to visit.     Objective:  Objective  Physical Exam Constitutional:      General: She is not in acute distress.    Appearance: Normal appearance. She is not ill-appearing or toxic-appearing.  HENT:     Head: Normocephalic and atraumatic.     Right Ear: Tympanic membrane, ear canal and external ear normal.     Left Ear: Tympanic membrane, ear canal and external ear normal.     Nose: No congestion or rhinorrhea.  Eyes:     Extraocular Movements: Extraocular movements intact.     Pupils: Pupils are equal, round, and reactive to light.  Cardiovascular:  Rate and Rhythm: Normal rate and regular rhythm.     Pulses: Normal pulses.     Heart sounds: Normal heart sounds. No murmur heard.   Pulmonary:     Effort: Pulmonary effort is normal. No respiratory distress.     Breath sounds: Normal breath sounds. No wheezing, rhonchi or rales.  Abdominal:     General: Bowel sounds are normal.     Palpations: Abdomen is soft. There is no  mass.     Tenderness: There is no abdominal tenderness. There is no guarding.     Hernia: No hernia is present.  Musculoskeletal:        General: Normal range of motion.     Cervical back: Normal range of motion and neck supple.  Skin:    General: Skin is warm and dry.  Neurological:     Mental Status: She is alert and oriented to person, place, and time.  Psychiatric:        Behavior: Behavior normal.    BP 128/70   Pulse 76   Temp 98.3 F (36.8 C)   Resp 18   Ht 5\' 4"  (1.626 m)   Wt 216 lb 6.4 oz (98.2 kg)   BMI 37.14 kg/m  Wt Readings from Last 3 Encounters:  08/29/20 216 lb 6.4 oz (98.2 kg)  06/23/19 200 lb (90.7 kg)  02/07/18 191 lb (86.6 kg)     Lab Results  Component Value Date   WBC 6.5 08/15/2018   HGB 12.0 08/15/2018   HCT 36.4 08/15/2018   PLT 324 08/15/2018   GLUCOSE 84 09/02/2017   CHOL 127 07/04/2012   TRIG 55 07/04/2012   HDL 51 07/04/2012   LDLCALC 65 07/04/2012   ALT 16 09/02/2017   AST 18 09/02/2017   NA 137 09/02/2017   K 3.6 09/02/2017   CL 104 09/02/2017   CREATININE 0.73 09/02/2017   BUN 10 09/02/2017   CO2 23 09/02/2017   TSH 0.55 01/26/2018   HGBA1C 5.3 12/15/2017    12/17/2017 PELVIS TRANSVANGINAL NON-OB (TV ONLY)  Result Date: 02/01/2018 CLINICAL DATA:  Spontaneous abortion approximately 5 days ago. Uterine mass on outside ultrasound. EXAM: ULTRASOUND PELVIS TRANSVAGINAL TECHNIQUE: Transvaginal ultrasound examination of the pelvis was performed including evaluation of the uterus, ovaries, adnexal regions, and pelvic cul-de-sac. COMPARISON:  None. FINDINGS: Uterus Measurements: 8.7 x 5.6 x 6.9 cm = volume: 168 mL. Retroverted. No fibroids identified. Endometrium Thickness: 13 mm. A focal soft tissue mass is seen within the endometrial cavity which shows internal blood flow on color Doppler ultrasound. This measures 1.3 x 2.9 cm, and is consistent with retained products of conception. Right ovary Measurements: 4.3 x 1.6 x 1.7 cm = volume: 5.8 mL.  Normal appearance/no adnexal mass. Left ovary Measurements: 3.5 x 1.5 x 2.7 cm = volume: 7.1 mL. Normal appearance/no adnexal mass. Other findings:  No abnormal free fluid IMPRESSION: 2.9 x 1.3 cm focal soft tissue mass in endometrial cavity, consistent with retained products of conception. No evidence of fibroids.  Normal appearance of both ovaries. Electronically Signed   By: 13/07/2017 M.D.   On: 02/01/2018 10:57     Assessment & Plan:  Plan    Meds ordered this encounter  Medications  . venlafaxine XR (EFFEXOR-XR) 75 MG 24 hr capsule    Sig: Take 1 capsule (75 mg total) by mouth daily with breakfast.    Dispense:  90 capsule    Refill:  1    Please fill this one  instead of the 30 day one that was sent in.  . clonazePAM (KLONOPIN) 0.5 MG tablet    Sig: TAKE 1/2 TO 1 (ONE-HALF TO ONE) TABLET BY MOUTH TWICE DAILY AS NEEDED FOR ANXIETY    Dispense:  30 tablet    Refill:  0    Problem List Items Addressed This Visit    Depression with anxiety - Primary    Patient is doing well on Venlafaxine 75 mg daily and uses Clonazepam infrequently at this time may continue both meds      Relevant Medications   venlafaxine XR (EFFEXOR-XR) 75 MG 24 hr capsule   Recurrent pregnancy loss    She is following with gynecology in [place] [employer] she is encouraged to have a preconception appointment with her obstetrician to discuss medications and next steps. Maintain a prenatal vitamin daily while waiting.         Follow-up: Return in about 7 months (around 04/04/2021) for annual exam.   I,David Hanna,acting as a scribe for Danise Edge, MD.,have documented all relevant documentation on the behalf of Danise Edge, MD,as directed by  Danise Edge, MD while in the presence of Danise Edge, MD.  I, Bradd Canary, MD personally performed the services described in this documentation. All medical record entries made by the scribe were at my direction and in my presence. I have reviewed the chart and agree  that the record reflects my personal performance and is accurate and complete

## 2021-03-09 ENCOUNTER — Encounter: Payer: Self-pay | Admitting: Family Medicine

## 2021-03-10 ENCOUNTER — Other Ambulatory Visit: Payer: Self-pay | Admitting: Family Medicine

## 2021-03-10 MED ORDER — AZITHROMYCIN 250 MG PO TABS: ORAL_TABLET | ORAL | 0 refills | Status: AC

## 2021-03-10 MED ORDER — BENZONATATE 100 MG PO CAPS: 100.0000 mg | ORAL_CAPSULE | Freq: Three times a day (TID) | ORAL | 0 refills | Status: DC | PRN

## 2021-04-10 ENCOUNTER — Encounter: Payer: BLUE CROSS/BLUE SHIELD | Admitting: Family Medicine

## 2021-04-14 ENCOUNTER — Other Ambulatory Visit: Payer: Self-pay | Admitting: Family Medicine

## 2021-07-13 ENCOUNTER — Other Ambulatory Visit: Payer: Self-pay | Admitting: Family Medicine

## 2021-08-12 ENCOUNTER — Ambulatory Visit (INDEPENDENT_AMBULATORY_CARE_PROVIDER_SITE_OTHER): Payer: BLUE CROSS/BLUE SHIELD | Admitting: Family

## 2021-08-12 VITALS — BP 132/89 | HR 78 | Temp 98.9°F | Resp 16 | Wt 206.0 lb

## 2021-08-12 DIAGNOSIS — J45909 Unspecified asthma, uncomplicated: Secondary | ICD-10-CM | POA: Diagnosis not present

## 2021-08-12 DIAGNOSIS — F418 Other specified anxiety disorders: Secondary | ICD-10-CM | POA: Diagnosis not present

## 2021-08-12 DIAGNOSIS — Z79899 Other long term (current) drug therapy: Secondary | ICD-10-CM

## 2021-08-12 MED ORDER — VENLAFAXINE HCL ER 75 MG PO CP24: 75.0000 mg | ORAL_CAPSULE | Freq: Every day | ORAL | 1 refills | Status: DC

## 2021-08-12 MED ORDER — ALBUTEROL SULFATE HFA 108 (90 BASE) MCG/ACT IN AERS: 2.0000 | INHALATION_SPRAY | Freq: Four times a day (QID) | RESPIRATORY_TRACT | 5 refills | Status: DC | PRN

## 2021-08-12 NOTE — Patient Instructions (Signed)
Please complete lab work prior to leaving.   

## 2021-08-12 NOTE — Assessment & Plan Note (Addendum)
Stable/improved with effexor. She notes that she is trying to become pregnant and wonders if it is OK to continue effexor. I advised her to check with her OB/GYN, but that most likely benefit of being on the medication outweighs risk in her case.  She wishes to have the option to continue clonazepam. Controlled substance contract is signed today, and UDS will be obtained.  ?

## 2021-08-12 NOTE — Progress Notes (Signed)
? ?Subjective:  ? ?By signing my name below, I, Marisa Fletcher, attest that this documentation has been prepared under the direction and in the presence of Marisa Alar NP, 08/12/2021 ? ? Patient ID: Marisa Fletcher, female    DOB: Mar 20, 1986, 36 y.o.   MRN: [id number] ? ?Chief Complaint  ?Patient presents with  ? Depression  ?  Here for follow up, "doing well on Effexor"  ? ? ?HPI ?Patient is in today for an office visit. ? ?Anxiety/Mood - She is taking 75 Mg of Effexor - XR and states that medication has been improving her symptoms. She has been concerned about missing a dose because the side effects are unpleasant for her. She experiences side effects such as headaches. Her and her husband are trying for a child again. She is concerned about the effects of Effexor - XR while trying.  ?Clonazepam - She likes to use 0.5 Mg of Clonazepam when needed.  ?Albuterol - She has not been using 108 MCG/ACT of Albuterol because the medication is too expensive for her.  ?Insurance - She states that this current location does not take her insurance. She is hoping not to have to switch locations so she is paying cash.  ? ?Health Maintenance Due  ?Topic Date Due  ? PAP SMEAR-Modifier  Never done  ? ? ?Past Medical History:  ?Diagnosis Date  ? Allergy   ? 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 Asthma   ? Cholelithiasis   ? Depression   ? Depression with anxiety 04/17/2010  ? Qualifier: Diagnosis of  By: Charlett Blake MD, Erline Levine    ? Glucosuria 04/15/2014  ? Pancreatitis   ? Preventative health care 07/09/2012  ? ? ?Past Surgical History:  ?Procedure Laterality Date  ? CHOLECYSTECTOMY  2007  ? with pancreatitis  ? DILATION AND CURETTAGE OF UTERUS    ? wisdom teeth extracted    ? ? ?Family History  ?Problem Relation Age of Onset  ? Cholelithiasis Father   ? Allergies Father   ? Asthma Father   ? Anxiety disorder Sister   ? ADD / ADHD Brother   ?     ADD  ? Memory loss Maternal Grandmother   ? Depression Maternal Grandmother   ?     possible bipolar  ?  Obesity Maternal Grandmother   ? Alcohol abuse Maternal Grandfather   ? Cancer Paternal Grandmother   ? Heart disease Paternal Grandmother   ? Asthma Paternal Grandfather   ? Allergies Paternal Grandfather   ? Cholelithiasis Paternal Grandfather   ? ADD / ADHD Sister   ?     ADD  ? Other Mother   ?     Back disease  ? ? ?Social History  ? ?Socioeconomic History  ? Marital status: Married  ?  Spouse name: Not on file  ? Number of children: Not on file  ? Years of education: Not on file  ? Highest education level: Not on file  ?Occupational History  ? Occupation: [employer]  ?Tobacco Use  ? Smoking status: Former  ?  Packs/day: 0.10  ?  Types: Cigarettes  ?  Quit date: 12/28/2009  ?  Years since quitting: 11.6  ? Smokeless tobacco: Never  ?Vaping Use  ? Vaping Use: Never used  ?Substance and Sexual Activity  ? Alcohol use: Not Currently  ?  Alcohol/week: 0.0 standard drinks  ? Drug use: No  ? Sexual activity: Yes  ?  Birth control/protection: None  ?Other Topics Concern  ?  Not on file  ?Social History Narrative  ? Not on file  ? ?Social Determinants of Health  ? ?Financial Resource Strain: Not on file  ?Food Insecurity: Not on file  ?Transportation Needs: Not on file  ?Physical Activity: Not on file  ?Stress: Not on file  ?Social Connections: Not on file  ?Intimate Partner Violence: Not on file  ? ? ?Outpatient Medications Prior to Visit  ?Medication Sig Dispense Refill  ? albuterol (VENTOLIN HFA) 108 (90 Base) MCG/ACT inhaler Inhale 2 puffs into the lungs every 6 (six) hours as needed for wheezing or shortness of breath. 18 g 1  ? venlafaxine XR (EFFEXOR-XR) 75 MG 24 hr capsule TAKE 1 CAPSULE BY MOUTH ONCE DAILY WITH BREAKFAST 30 capsule 0  ? clonazePAM (KLONOPIN) 0.5 MG tablet TAKE 1/2 TO 1 (ONE-HALF TO ONE) TABLET BY MOUTH TWICE DAILY AS NEEDED FOR ANXIETY (Patient not taking: Reported on 08/12/2021) 30 tablet 0  ? benzonatate (TESSALON PERLES) 100 MG capsule Take 1 capsule (100 mg total) by mouth 3 (three) times  daily as needed for cough. 40 capsule 0  ? ?No facility-administered medications prior to visit.  ? ? ?Allergies  ?Allergen Reactions  ? Eggs Or Egg-Derived Products Swelling  ?  Swelling in throat  ? Peanut-Containing Drug Products Swelling  ?  Throat swelling  ? Beef-Derived Products   ? Citalopram Nausea Only  ?  insomnia  ? Gluten Meal   ? ? ?ROS ?See HPI ?   ?Objective:  ?  ?Physical Exam ?Constitutional:   ?   General: She is not in acute distress. ?   Appearance: Normal appearance. She is not ill-appearing.  ?HENT:  ?   Head: Normocephalic and atraumatic.  ?   Right Ear: External ear normal.  ?   Left Ear: External ear normal.  ?Eyes:  ?   Extraocular Movements: Extraocular movements intact.  ?   Pupils: Pupils are equal, round, and reactive to light.  ?Cardiovascular:  ?   Rate and Rhythm: Normal rate and regular rhythm.  ?   Heart sounds: Normal heart sounds. No murmur heard. ?  No gallop.  ?Pulmonary:  ?   Effort: Pulmonary effort is normal. No respiratory distress.  ?   Breath sounds: Normal breath sounds. No wheezing or rales.  ?Skin: ?   General: Skin is warm and dry.  ?Neurological:  ?   Mental Status: She is alert and oriented to person, place, and time.  ?Psychiatric:     ?   Mood and Affect: Mood normal.     ?   Behavior: Behavior normal.     ?   Judgment: Judgment normal.  ? ? ?BP 132/89 (BP Location: Right Arm, Patient Position: Sitting, Cuff Size: Large)   Pulse 78   Temp 98.9 ?F (37.2 ?C) (Oral)   Resp 16   Wt 206 lb (93.4 kg)   SpO2 100%   BMI 35.36 kg/m?  ?Wt Readings from Last 3 Encounters:  ?08/12/21 206 lb (93.4 kg)  ?08/29/20 216 lb 6.4 oz (98.2 kg)  ?06/23/19 200 lb (90.7 kg)  ? ? ?   ?Assessment & Plan:  ? ?Problem List Items Addressed This Visit   ? ?  ? Unprioritized  ? Depression with anxiety  ?  Stable/improved with effexor. She notes that she is trying to become pregnant and wonders if it is OK to continue effexor. I advised her to check with her OB/GYN, but that most likely  benefit of being on  the medication outweighs risk in her case.  She wishes to have the option to continue clonazepam. Controlled substance contract is signed today, and UDS will be obtained.  ? ?  ?  ? Relevant Medications  ? venlafaxine XR (EFFEXOR-XR) 75 MG 24 hr capsule  ? Asthma  ?  Having occasional asthma symptoms. Ventolin was too costly for her. Will send proair instead to see if this will be less expensive.   ? ?  ?  ? Relevant Medications  ? albuterol (VENTOLIN HFA) 108 (90 Base) MCG/ACT inhaler  ? ?Other Visit Diagnoses   ? ? High risk medication use    -  Primary  ? Relevant Orders  ? DRUG MONITORING, PANEL 8 WITH CONFIRMATION, URINE  ? ?  ? ? ? ? ?Meds ordered this encounter  ?Medications  ? venlafaxine XR (EFFEXOR-XR) 75 MG 24 hr capsule  ?  Sig: Take 1 capsule (75 mg total) by mouth daily with breakfast.  ?  Dispense:  90 capsule  ?  Refill:  1  ?  Order Specific Question:   Supervising Provider  ?  Answer:   Penni Homans A [id number]  ? albuterol (VENTOLIN HFA) 108 (90 Base) MCG/ACT inhaler  ?  Sig: Inhale 2 puffs into the lungs every 6 (six) hours as needed for wheezing or shortness of breath.  ?  Dispense:  6.7 g  ?  Refill:  5  ?  Order Specific Question:   Supervising Provider  ?  Answer:   Penni Homans A [id number]  ? ? ?I, Nance Pear, NP, personally preformed the services described in this documentation.  All medical record entries made by the scribe were at my direction and in my presence.  I have reviewed the chart and discharge instructions (if applicable) and agree that the record reflects my personal performance and is accurate and complete. 08/12/2021 ? ? ?I,Amber Collins,acting as a Education administrator for [employer], NP.,have documented all relevant documentation on the behalf of Nance Pear, NP,as directed by  Nance Pear, NP while in the presence of Nance Pear, NP. ? ? ? ? ? ?Nance Pear, NP ? ?

## 2021-08-12 NOTE — Assessment & Plan Note (Signed)
Having occasional asthma symptoms. Ventolin was too costly for her. Will send proair instead to see if this will be less expensive.   ?

## 2021-08-17 ENCOUNTER — Telehealth: Payer: Self-pay | Admitting: Family

## 2021-08-17 NOTE — Telephone Encounter (Signed)
Looks like pt forgot to stop by the lab to complete UDS. Please schedule lab visit.

## 2021-08-18 NOTE — Telephone Encounter (Signed)
Lvm for patient to call back for lab appointment. Order for UDS changed to future.

## 2021-08-18 NOTE — Addendum Note (Signed)
Addended by: Jiles Prows on: 08/18/2021 02:23 PM   Modules accepted: Orders

## 2021-09-15 ENCOUNTER — Telehealth: Payer: Self-pay | Admitting: Family

## 2021-09-15 NOTE — Telephone Encounter (Signed)
It looks like she forgot to stop by the lab on the way out to complete UDS. Please schedule lab visit.

## 2021-09-16 NOTE — Telephone Encounter (Signed)
Called but no answer, lvm for patient to be aware she needs to call and schedule a lab appointment for UDS. Orders in chart as future.

## 2021-09-19 ENCOUNTER — Encounter: Payer: Self-pay | Admitting: Family

## 2022-01-25 ENCOUNTER — Other Ambulatory Visit: Payer: Self-pay | Admitting: Family

## 2022-03-03 ENCOUNTER — Encounter: Payer: BLUE CROSS/BLUE SHIELD | Admitting: Family Medicine

## 2022-05-06 ENCOUNTER — Other Ambulatory Visit: Payer: Self-pay | Admitting: Family

## 2022-07-30 ENCOUNTER — Other Ambulatory Visit: Payer: Self-pay | Admitting: Family Medicine

## 2022-08-27 NOTE — Progress Notes (Signed)
Subjective:   By signing my name below, I, Isabelle Course, attest that this documentation has been prepared under the direction and in the presence of Lemont Fillers, NP 08/28/22   Patient ID: Marisa Fletcher, female    DOB: [birth year], 37 y.o.   MRN: [id number]  Chief Complaint  Patient presents with   Asthma    Here for follow up, "need inhaler"   Depression    "Doing well"    HPI Patient is in today for an office visit.   Asthma: She reports her asthma has worsened during allergy season. She tends to use her inhaler every couple of days. She notes she may need to use her inhaler more often than she actually does.   Mood: Her mood has been overall stable. She has been compliant with 175 mg Effexor-XR daily. She is tolerating the Effexor well and states it has been very helpful.   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   Asthma    Cholelithiasis    Depression    Depression with anxiety 04/17/2010   Qualifier: Diagnosis of  By: Abner Greenspan MD, Huston Foley 04/15/2014   Pancreatitis    Preventative health care 07/09/2012    Past Surgical History:  Procedure Laterality Date   CHOLECYSTECTOMY  2007   with pancreatitis   DILATION AND CURETTAGE OF UTERUS     wisdom teeth extracted      Family History  Problem Relation Age of Onset   Cholelithiasis Father    Allergies Father    Asthma Father    Anxiety disorder Sister    ADD / ADHD Brother        ADD   Memory loss Maternal Grandmother    Depression Maternal Grandmother        possible bipolar   Obesity Maternal Grandmother    Alcohol abuse Maternal Grandfather    Cancer Paternal Grandmother    Heart disease Paternal Grandmother    Asthma Paternal Grandfather    Allergies Paternal Grandfather    Cholelithiasis Paternal Grandfather    ADD / ADHD Sister        ADD   Other Mother        Back disease    Social History   Socioeconomic History   Marital status:  Married    Spouse name: Not on file   Number of children: Not on file   Years of education: Not on file   Highest education level: Not on file  Occupational History   Occupation: [employer]  Tobacco Use   Smoking status: Former    Packs/day: .1    Types: Cigarettes    Quit date: 12/28/2009    Years since quitting: 12.6   Smokeless tobacco: Never  Vaping Use   Vaping Use: Never used  Substance and Sexual Activity   Alcohol use: Not Currently    Alcohol/week: 0.0 standard drinks of alcohol   Drug use: No   Sexual activity: Yes    Birth control/protection: None  Other Topics Concern   Not on file  Social History Narrative   Not on file   Social Determinants of Health   Financial Resource Strain: Not on file  Food Insecurity: Not on file  Transportation Needs: Not on file  Physical Activity: Not on file  Stress: Not on file  Social Connections: Not on file  Intimate Partner Violence: Not on file    Outpatient  Medications Prior to Visit  Medication Sig Dispense Refill   albuterol (VENTOLIN HFA) 108 (90 Base) MCG/ACT inhaler Inhale 2 puffs into the lungs every 6 (six) hours as needed for wheezing or shortness of breath. 6.7 g 5   clonazePAM (KLONOPIN) 0.5 MG tablet TAKE 1/2 TO 1 (ONE-HALF TO ONE) TABLET BY MOUTH TWICE DAILY AS NEEDED FOR ANXIETY 30 tablet 0   venlafaxine XR (EFFEXOR-XR) 75 MG 24 hr capsule Take 1 capsule (75 mg total) by mouth daily with breakfast. 30 capsule 0   No facility-administered medications prior to visit.    Allergies  Allergen Reactions   Egg-Derived Products Swelling    Swelling in throat   Peanut-Containing Drug Products Swelling    Throat swelling   Beef-Derived Products    Citalopram Nausea Only    insomnia   Gluten Meal     ROS See HPI    Objective:    Physical Exam Constitutional:      General: She is not in acute distress.    Appearance: Normal appearance. She is well-developed.  HENT:     Head: Normocephalic and  atraumatic.     Right Ear: External ear normal.     Left Ear: External ear normal.  Eyes:     General: No scleral icterus. Neck:     Thyroid: No thyromegaly.  Cardiovascular:     Rate and Rhythm: Normal rate and regular rhythm.     Heart sounds: Normal heart sounds. No murmur heard. Pulmonary:     Effort: Pulmonary effort is normal. No respiratory distress.     Breath sounds: Examination of the left-upper field reveals wheezing. Examination of the left-middle field reveals wheezing. Examination of the left-lower field reveals wheezing. Wheezing (Left sided expiratory wheeze) present.  Musculoskeletal:     Cervical back: Neck supple.  Skin:    General: Skin is warm and dry.  Neurological:     Mental Status: She is alert and oriented to person, place, and time.  Psychiatric:        Mood and Affect: Mood normal.        Behavior: Behavior normal.        Thought Content: Thought content normal.        Judgment: Judgment normal.     BP 129/74 (BP Location: Right Arm, Patient Position: Sitting, Cuff Size: Large)   Pulse 67   Temp 98.2 F (36.8 C) (Oral)   Resp 16   Ht 5\' 4"  (1.626 m)   Wt 211 lb (95.7 kg)   SpO2 100%   BMI 36.22 kg/m  Wt Readings from Last 3 Encounters:  08/28/22 211 lb (95.7 kg)  08/12/21 206 lb (93.4 kg)  08/29/20 216 lb 6.4 oz (98.2 kg)       Assessment & Plan:  Depression with anxiety Assessment & Plan: Stable on effexor 75mg  once daily.    Uncomplicated asthma, unspecified asthma severity, unspecified whether persistent Assessment & Plan: Uncontrolled. Will add advair one puff bid. Continue albuterol prn.   Orders: -     Fluticasone-Salmeterol; Inhale 1 puff into the lungs 2 (two) times daily.  Dispense: 1 each; Refill: 5 -     Albuterol Sulfate HFA; Inhale 2 puffs into the lungs every 6 (six) hours as needed for wheezing or shortness of breath.  Dispense: 6.7 g; Refill: 5  Other orders -     Venlafaxine HCl ER; Take 1 capsule (75 mg total) by  mouth daily with breakfast.  Dispense: 90 capsule; Refill:  1     I,Rachel Rivera,acting as a Neurosurgeon for Lemont Fillers, NP.,have documented all relevant documentation on the behalf of Lemont Fillers, NP,as directed by  Lemont Fillers, NP while in the presence of Lemont Fillers, NP.   I, Lemont Fillers, NP, personally preformed the services described in this documentation.  All medical record entries made by the scribe were at my direction and in my presence.  I have reviewed the chart and discharge instructions (if applicable) and agree that the record reflects my personal performance and is accurate and complete. 08/28/22   Lemont Fillers, NP

## 2022-08-28 ENCOUNTER — Ambulatory Visit: Payer: Managed Care, Other (non HMO) | Admitting: Family

## 2022-08-28 VITALS — BP 129/74 | HR 67 | Temp 98.2°F | Resp 16 | Ht 64.0 in | Wt 211.0 lb

## 2022-08-28 DIAGNOSIS — F418 Other specified anxiety disorders: Secondary | ICD-10-CM | POA: Diagnosis not present

## 2022-08-28 DIAGNOSIS — J45909 Unspecified asthma, uncomplicated: Secondary | ICD-10-CM

## 2022-08-28 MED ORDER — ALBUTEROL SULFATE HFA 108 (90 BASE) MCG/ACT IN AERS: 2.0000 | INHALATION_SPRAY | Freq: Four times a day (QID) | RESPIRATORY_TRACT | 5 refills | Status: DC | PRN

## 2022-08-28 MED ORDER — VENLAFAXINE HCL ER 75 MG PO CP24: 75.0000 mg | ORAL_CAPSULE | Freq: Every day | ORAL | 1 refills | Status: DC

## 2022-08-28 MED ORDER — FLUTICASONE-SALMETEROL 100-50 MCG/ACT IN AEPB: 1.0000 | INHALATION_SPRAY | Freq: Two times a day (BID) | RESPIRATORY_TRACT | 5 refills | Status: DC

## 2022-08-28 NOTE — Assessment & Plan Note (Signed)
Stable on effexor 75mg  once daily.

## 2022-08-28 NOTE — Assessment & Plan Note (Signed)
Uncontrolled. Will add advair one puff bid. Continue albuterol prn.

## 2022-10-28 ENCOUNTER — Encounter (INDEPENDENT_AMBULATORY_CARE_PROVIDER_SITE_OTHER): Payer: Self-pay

## 2022-11-23 ENCOUNTER — Ambulatory Visit: Payer: Managed Care, Other (non HMO) | Admitting: Family Medicine

## 2023-03-04 ENCOUNTER — Other Ambulatory Visit: Payer: Self-pay | Admitting: Family

## 2023-04-08 ENCOUNTER — Encounter: Payer: Self-pay | Admitting: Family

## 2023-04-08 DIAGNOSIS — J45909 Unspecified asthma, uncomplicated: Secondary | ICD-10-CM

## 2023-04-29 MED ORDER — ALBUTEROL SULFATE HFA 108 (90 BASE) MCG/ACT IN AERS
2.0000 | INHALATION_SPRAY | Freq: Four times a day (QID) | RESPIRATORY_TRACT | 5 refills | Status: DC | PRN
Start: 2023-04-29 — End: 2023-07-27

## 2023-05-31 ENCOUNTER — Other Ambulatory Visit: Payer: Self-pay | Admitting: Family

## 2023-06-02 ENCOUNTER — Other Ambulatory Visit: Payer: Self-pay | Admitting: Family

## 2023-06-02 MED ORDER — VENLAFAXINE HCL ER 75 MG PO CP24: 75.0000 mg | ORAL_CAPSULE | Freq: Every day | ORAL | 0 refills | Status: DC

## 2023-06-30 ENCOUNTER — Other Ambulatory Visit: Payer: Self-pay | Admitting: Family Medicine

## 2023-07-02 ENCOUNTER — Other Ambulatory Visit: Payer: Self-pay | Admitting: Family Medicine

## 2023-07-05 ENCOUNTER — Other Ambulatory Visit: Payer: Self-pay | Admitting: Family Medicine

## 2023-07-05 MED ORDER — VENLAFAXINE HCL ER 75 MG PO CP24: 75.0000 mg | ORAL_CAPSULE | Freq: Every day | ORAL | 0 refills | Status: DC

## 2023-07-27 ENCOUNTER — Encounter: Payer: Self-pay | Admitting: Family

## 2023-07-27 ENCOUNTER — Ambulatory Visit: Admitting: Family

## 2023-07-27 VITALS — BP 144/85 | HR 63 | Temp 97.5°F | Resp 16 | Ht 64.0 in | Wt 211.0 lb

## 2023-07-27 DIAGNOSIS — F418 Other specified anxiety disorders: Secondary | ICD-10-CM | POA: Diagnosis not present

## 2023-07-27 DIAGNOSIS — E049 Nontoxic goiter, unspecified: Secondary | ICD-10-CM

## 2023-07-27 DIAGNOSIS — J45909 Unspecified asthma, uncomplicated: Secondary | ICD-10-CM

## 2023-07-27 LAB — TSH: TSH: 0.39 u[IU]/mL (ref 0.35–5.50)

## 2023-07-27 LAB — T3, FREE: T3, Free: 3.5 pg/mL (ref 2.3–4.2)

## 2023-07-27 LAB — T4, FREE: Free T4: 0.81 ng/dL (ref 0.60–1.60)

## 2023-07-27 MED ORDER — FLUTICASONE-SALMETEROL 100-50 MCG/ACT IN AEPB: 1.0000 | INHALATION_SPRAY | Freq: Two times a day (BID) | RESPIRATORY_TRACT | 5 refills | Status: AC

## 2023-07-27 MED ORDER — ALBUTEROL SULFATE HFA 108 (90 BASE) MCG/ACT IN AERS: 2.0000 | INHALATION_SPRAY | Freq: Four times a day (QID) | RESPIRATORY_TRACT | 5 refills | Status: AC | PRN

## 2023-07-27 MED ORDER — VENLAFAXINE HCL ER 75 MG PO CP24
75.0000 mg | ORAL_CAPSULE | Freq: Every day | ORAL | 1 refills | Status: DC
Start: 2023-07-27 — End: 2024-01-20

## 2023-07-27 NOTE — Progress Notes (Signed)
 Subjective:     Patient ID: Marisa Fletcher, female    DOB: May 29, 1985, 38 y.o.   MRN: [id number]  Chief Complaint  Patient presents with   Asthma    Here for follow up, needs inhaler    Depression    " Doing well on medication"   Neck swelling    Patient reports swelling of neck, thyroid     HPI  Discussed the use of AI scribe software for clinical note transcription with the patient, who gave verbal consent to proceed.  History of Present Illness Marisa Fletcher is a 38 year old female who presents for a medication follow-up and evaluation of thyroid swelling.  She experiences a persistent sense of fullness in her neck, with swelling more pronounced on certain days. She has become more aware of her symptoms after a customer mentioned Hashimoto's disease. There is no diarrhea, increased anxiety, or palpitations, although she experiences occasional panic attacks. Her current medication effectively calms her mind and reduces stress over minor issues.  Her asthma has been exacerbated recently, particularly following a cold a couple of weeks ago, leading to difficulty catching her breath and associated pain. She has been using her inhaler more frequently, which sometimes causes shakiness. She did not use her inhaler this morning to avoid feeling 'icky.' She forgot to refill her Advair prescription, which she believes was beneficial in the past.  She has a history of three pregnancy losses, which causes anxiety during gynecological visits. She is unsure if her Pap smear is up to date. Her last visit to GYN was 2020.  She is concerned about PCOS and its impact on her weight noting that she eats well but struggles with weight loss.      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   Asthma    Cholelithiasis    Depression    Depression with anxiety 04/17/2010   Qualifier: Diagnosis of  By: Rodrick Clapper MD, Caren Channel 04/15/2014   Pancreatitis    Preventative health care 07/09/2012    Past Surgical History:  Procedure Laterality Date   CHOLECYSTECTOMY  2007   with pancreatitis   DILATION AND CURETTAGE OF UTERUS     wisdom teeth extracted      Family History  Problem Relation Age of Onset   Cholelithiasis Father    Allergies Father    Asthma Father    Anxiety disorder Sister    ADD / ADHD Brother        ADD   Memory loss Maternal Grandmother    Depression Maternal Grandmother        possible bipolar   Obesity Maternal Grandmother    Alcohol abuse Maternal Grandfather    Cancer Paternal Grandmother    Heart disease Paternal Grandmother    Asthma Paternal Grandfather    Allergies Paternal Grandfather    Cholelithiasis Paternal Grandfather    ADD / ADHD Sister        ADD   Other Mother        Back disease    Social History   Socioeconomic History   Marital status: Married    Spouse  name: Not on file   Number of children: Not on file   Years of education: Not on file   Highest education level: Some college, no degree  Occupational History   Occupation: [employer]  Tobacco Use   Smoking status: Former    Current packs/day: 0.00    Types: Cigarettes    Quit date: 12/28/2009    Years since quitting: 13.5   Smokeless tobacco: Never  Vaping Use   Vaping status: Never Used  Substance and Sexual Activity   Alcohol use: Not Currently    Alcohol/week: 0.0 standard drinks of alcohol   Drug use: No   Sexual activity: Yes    Birth control/protection: None  Other Topics Concern   Not on file  Social History Narrative   Not on file   Social Drivers of Health   Financial Resource Strain: Medium Risk (07/27/2023)   Overall Financial Resource Strain (CARDIA)    Difficulty of Paying  Living Expenses: Somewhat hard  Food Insecurity: No Food Insecurity (07/27/2023)   Hunger Vital Sign    Worried About Running Out of Food in the Last Year: Never true    Ran Out of Food in the Last Year: Never true  Transportation Needs: No Transportation Needs (07/27/2023)   PRAPARE - Administrator, Civil Service (Medical): No    Lack of Transportation (Non-Medical): No  Physical Activity: Insufficiently Active (07/27/2023)   Exercise Vital Sign    Days of Exercise per Week: 3 days    Minutes of Exercise per Session: 20 min  Stress: No Stress Concern Present (07/27/2023)   [employer] of Occupational Health - Occupational Stress Questionnaire    Feeling of Stress : Only a little  Social Connections: Moderately Isolated (07/27/2023)   Social Connection and Isolation Panel [NHANES]    Frequency of Communication with Friends and Family: Twice a week    Frequency of Social Gatherings with Friends and Family: Twice a week    Attends Religious Services: Never    Database administrator or Organizations: No    Attends Engineer, structural: Not on file    Marital Status: Married  Catering manager Violence: Not on file    Outpatient Medications Prior to Visit  Medication Sig Dispense Refill   albuterol  (VENTOLIN  HFA) 108 (90 Base) MCG/ACT inhaler Inhale 2 puffs into the lungs every 6 (six) hours as needed for wheezing or shortness of breath. 18 g 5   fluticasone -salmeterol (ADVAIR) 100-50 MCG/ACT AEPB Inhale 1 puff into the lungs 2 (two) times daily. 1 each 5   venlafaxine  XR (EFFEXOR -XR) 75 MG 24 hr capsule Take 1 capsule (75 mg total) by mouth daily with breakfast. 30 capsule 0   No facility-administered medications prior to visit.    Allergies  Allergen Reactions   Egg-Derived Products Swelling    Swelling in throat   Peanut-Containing Drug Products Swelling    Throat swelling   Beef-Derived Drug Products    Citalopram  Nausea Only    insomnia   Gluten Meal      ROS     Objective:    Physical Exam Constitutional:      General: She is not in acute distress.    Appearance: Normal appearance. She is well-developed.  HENT:     Head: Normocephalic and atraumatic.     Right Ear: External ear normal.     Left Ear: External ear normal.  Eyes:     General: No scleral icterus. Neck:  Thyroid: No thyromegaly.     Comments: Thyroid exam limited by overlying adipose tissue, but ? Slight right sided fullness Cardiovascular:     Rate and Rhythm: Normal rate and regular rhythm.     Heart sounds: Normal heart sounds. No murmur heard. Pulmonary:     Effort: Pulmonary effort is normal. No respiratory distress.     Breath sounds: Normal breath sounds. No wheezing.  Musculoskeletal:     Cervical back: Neck supple.  Skin:    General: Skin is warm and dry.  Neurological:     Mental Status: She is alert and oriented to person, place, and time.  Psychiatric:        Mood and Affect: Mood normal.        Behavior: Behavior normal.        Thought Content: Thought content normal.        Judgment: Judgment normal.      BP (!) 144/85 (BP Location: Right Arm, Patient Position: Sitting, Cuff Size: Large)   Pulse 63   Temp (!) 97.5 F (36.4 C) (Oral)   Resp 16   Ht 5\' 4"  (1.626 m)   Wt 211 lb (95.7 kg)   SpO2 100%   BMI 36.22 kg/m  Wt Readings from Last 3 Encounters:  07/27/23 211 lb (95.7 kg)  08/28/22 211 lb (95.7 kg)  08/12/21 206 lb (93.4 kg)       Assessment & Plan:   Problem List Items Addressed This Visit       Unprioritized   Depression with anxiety   Relevant Medications   venlafaxine  XR (EFFEXOR -XR) 75 MG 24 hr capsule   Asthma   Has been worse recently.  Advised pt to restart advair.       Relevant Medications   fluticasone -salmeterol (ADVAIR) 100-50 MCG/ACT AEPB   albuterol  (VENTOLIN  HFA) 108 (90 Base) MCG/ACT inhaler   Other Visit Diagnoses       Thyroid enlargement    -  Primary   Relevant Orders   TSH   T3,  free   T4, free   US  THYROID       I am having Marisa Pilar "Tzipporah Penson" maintain her fluticasone -salmeterol, venlafaxine  XR, and albuterol .  Meds ordered this encounter  Medications   fluticasone -salmeterol (ADVAIR) 100-50 MCG/ACT AEPB    Sig: Inhale 1 puff into the lungs 2 (two) times daily.    Dispense:  1 each    Refill:  5   venlafaxine  XR (EFFEXOR -XR) 75 MG 24 hr capsule    Sig: Take 1 capsule (75 mg total) by mouth daily with breakfast.    Dispense:  90 capsule    Refill:  1   albuterol  (VENTOLIN  HFA) 108 (90 Base) MCG/ACT inhaler    Sig: Inhale 2 puffs into the lungs every 6 (six) hours as needed for wheezing or shortness of breath.    Dispense:  18 g    Refill:  5

## 2023-07-27 NOTE — Assessment & Plan Note (Addendum)
 Has been worse recently.  Advised pt to restart advair.

## 2023-07-27 NOTE — Patient Instructions (Signed)
 VISIT SUMMARY:  Today, you came in for a follow-up on your medication and to evaluate the swelling in your thyroid. We discussed your asthma, thyroid swelling, and concerns about PCOS. We also reviewed your general health maintenance, including the need for a Pap smear.  YOUR PLAN:  -ASTHMA: Asthma is a condition where your airways narrow and swell, making it difficult to breathe. Your asthma has been worse recently due to allergies and a recent cold. You should restart using Advair through the spring to help reduce your reliance on the albuterol  inhaler, which has been causing you to feel shaky. We have sent your prescription to the [facility] pharmacy.  -THYROID SWELLING: Thyroid swelling can be a sign of an underlying thyroid issue. You have reported a sense of fullness in your neck and occasional swelling. We will conduct thyroid function tests and an ultrasound to check for any enlargement or cysts. Please schedule an appointment for the ultrasound.  -PCOS: Polycystic Ovary Syndrome (PCOS) is a hormonal disorder that can cause weight gain and pelvic pain. We discussed your concerns about PCOS and its impact on your weight.  -GENERAL HEALTH MAINTENANCE: Your last Pap smear was in 2020, and it is time to schedule another one. You can do this during your physical or with your gynecologist.  INSTRUCTIONS:  Please schedule an appointment for the thyroid ultrasound and a Pap smear. Your Advair prescription has been sent to the [facility], so you can pick it up there.

## 2023-07-30 ENCOUNTER — Telehealth (HOSPITAL_BASED_OUTPATIENT_CLINIC_OR_DEPARTMENT_OTHER): Payer: Self-pay

## 2023-08-27 ENCOUNTER — Telehealth (HOSPITAL_BASED_OUTPATIENT_CLINIC_OR_DEPARTMENT_OTHER): Payer: Self-pay

## 2023-09-06 ENCOUNTER — Encounter: Payer: Self-pay | Admitting: Family

## 2023-09-07 ENCOUNTER — Encounter: Admitting: Family

## 2023-10-06 ENCOUNTER — Encounter: Payer: Self-pay | Admitting: Family

## 2023-10-12 ENCOUNTER — Encounter: Admitting: Family

## 2023-11-17 ENCOUNTER — Encounter: Admitting: Family

## 2024-01-20 ENCOUNTER — Other Ambulatory Visit: Payer: Self-pay | Admitting: Family

## 2024-01-20 DIAGNOSIS — F418 Other specified anxiety disorders: Secondary | ICD-10-CM

## 2024-04-25 ENCOUNTER — Other Ambulatory Visit: Payer: Self-pay | Admitting: Family Medicine

## 2024-04-25 DIAGNOSIS — F418 Other specified anxiety disorders: Secondary | ICD-10-CM

## 2024-04-28 ENCOUNTER — Encounter: Payer: Self-pay | Admitting: Family
